# Patient Record
Sex: Female | Born: 1935 | Race: White | Hispanic: No | Smoking: Never smoker
Health system: Southern US, Community
[De-identification: ages and names within clinical notes are randomized; demographics above are authoritative.]

## PROBLEM LIST (undated history)

## (undated) DIAGNOSIS — H269 Unspecified cataract: Secondary | ICD-10-CM

## (undated) DIAGNOSIS — M1990 Unspecified osteoarthritis, unspecified site: Secondary | ICD-10-CM

## (undated) DIAGNOSIS — N189 Chronic kidney disease, unspecified: Secondary | ICD-10-CM

## (undated) DIAGNOSIS — H9190 Unspecified hearing loss, unspecified ear: Secondary | ICD-10-CM

## (undated) DIAGNOSIS — F0390 Unspecified dementia without behavioral disturbance: Secondary | ICD-10-CM

## (undated) DIAGNOSIS — R011 Cardiac murmur, unspecified: Secondary | ICD-10-CM

## (undated) HISTORY — DX: Cardiac murmur, unspecified: R01.1

## (undated) HISTORY — DX: Unspecified osteoarthritis, unspecified site: M19.90

## (undated) HISTORY — DX: Unspecified cataract: H26.9

## (undated) HISTORY — DX: Chronic kidney disease, unspecified: N18.9

## (undated) HISTORY — PX: JOINT REPLACEMENT: SHX530

## (undated) HISTORY — DX: Unspecified hearing loss, unspecified ear: H91.90

## (undated) HISTORY — PX: EYE SURGERY: SHX253

## (undated) HISTORY — PX: FRACTURE SURGERY: SHX138

## (undated) HISTORY — PX: APPENDECTOMY: SHX54

## (undated) HISTORY — DX: Unspecified dementia, unspecified severity, without behavioral disturbance, psychotic disturbance, mood disturbance, and anxiety: F03.90

---

## 2001-02-25 ENCOUNTER — Other Ambulatory Visit: Admission: RE | Admit: 2001-02-25 | Discharge: 2001-02-25 | Payer: Self-pay | Admitting: Gynecology

## 2002-05-29 ENCOUNTER — Encounter: Admission: RE | Admit: 2002-05-29 | Discharge: 2002-05-29 | Payer: Self-pay | Admitting: Geriatric Medicine

## 2002-05-29 ENCOUNTER — Encounter: Payer: Self-pay | Admitting: Geriatric Medicine

## 2003-05-27 ENCOUNTER — Other Ambulatory Visit: Admission: RE | Admit: 2003-05-27 | Discharge: 2003-05-27 | Payer: Self-pay | Admitting: Internal Medicine

## 2003-06-01 ENCOUNTER — Encounter: Admission: RE | Admit: 2003-06-01 | Discharge: 2003-06-01 | Payer: Self-pay | Admitting: Geriatric Medicine

## 2004-06-07 ENCOUNTER — Encounter: Admission: RE | Admit: 2004-06-07 | Discharge: 2004-06-07 | Payer: Self-pay | Admitting: Geriatric Medicine

## 2004-08-03 ENCOUNTER — Other Ambulatory Visit: Admission: RE | Admit: 2004-08-03 | Discharge: 2004-08-03 | Payer: Self-pay | Admitting: Gynecology

## 2005-03-19 ENCOUNTER — Encounter: Admission: RE | Admit: 2005-03-19 | Discharge: 2005-03-19 | Payer: Self-pay | Admitting: Geriatric Medicine

## 2005-05-11 ENCOUNTER — Encounter: Admission: RE | Admit: 2005-05-11 | Discharge: 2005-05-11 | Payer: Self-pay | Admitting: Geriatric Medicine

## 2005-05-16 ENCOUNTER — Encounter: Admission: RE | Admit: 2005-05-16 | Discharge: 2005-05-16 | Payer: Self-pay | Admitting: Geriatric Medicine

## 2005-06-11 ENCOUNTER — Encounter: Admission: RE | Admit: 2005-06-11 | Discharge: 2005-06-11 | Payer: Self-pay | Admitting: Geriatric Medicine

## 2005-09-25 ENCOUNTER — Other Ambulatory Visit: Admission: RE | Admit: 2005-09-25 | Discharge: 2005-09-25 | Payer: Self-pay | Admitting: Gynecology

## 2006-02-04 ENCOUNTER — Encounter: Admission: RE | Admit: 2006-02-04 | Discharge: 2006-02-04 | Payer: Self-pay | Admitting: Geriatric Medicine

## 2006-07-02 ENCOUNTER — Encounter: Admission: RE | Admit: 2006-07-02 | Discharge: 2006-07-02 | Payer: Self-pay | Admitting: Geriatric Medicine

## 2006-10-01 ENCOUNTER — Other Ambulatory Visit: Admission: RE | Admit: 2006-10-01 | Discharge: 2006-10-01 | Payer: Self-pay | Admitting: Gynecology

## 2007-01-14 ENCOUNTER — Observation Stay (HOSPITAL_COMMUNITY): Admission: EM | Admit: 2007-01-14 | Discharge: 2007-01-17 | Payer: Self-pay | Admitting: Emergency Medicine

## 2007-01-27 ENCOUNTER — Inpatient Hospital Stay (HOSPITAL_COMMUNITY): Admission: RE | Admit: 2007-01-27 | Discharge: 2007-01-31 | Payer: Self-pay | Admitting: Orthopedic Surgery

## 2007-07-11 ENCOUNTER — Encounter: Admission: RE | Admit: 2007-07-11 | Discharge: 2007-07-11 | Payer: Self-pay | Admitting: Geriatric Medicine

## 2008-02-04 ENCOUNTER — Encounter: Admission: RE | Admit: 2008-02-04 | Discharge: 2008-02-04 | Payer: Self-pay | Admitting: Geriatric Medicine

## 2008-05-19 IMAGING — CR DG SHOULDER 2+V PORT*R*
2 series · 2 of 2 positions shown · non-contrast
Comparison: none

CLINICAL DATA: Right shoulder fracture.   
PORTABLE RIGHT SHOULDER ? 01/27/07 AT 8125 HOURS:

[view not recorded (1 of 2)]
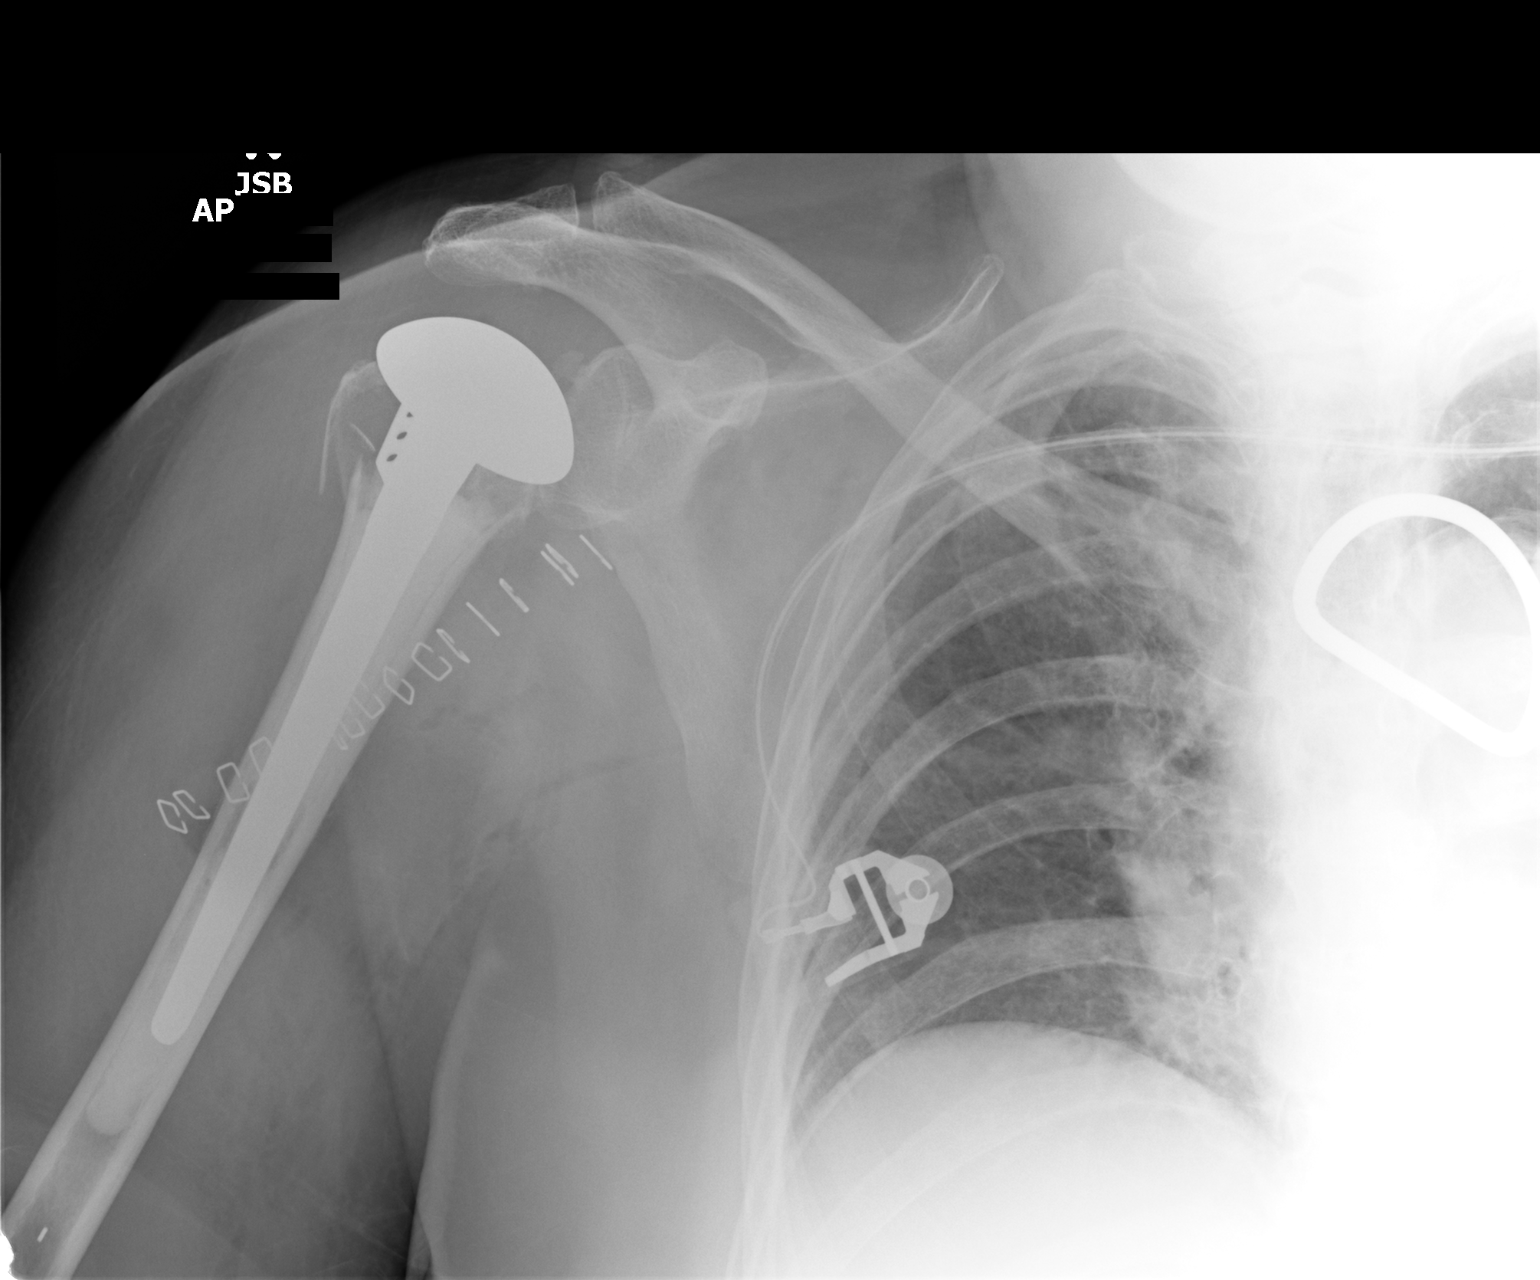

[view not recorded (2 of 2)]
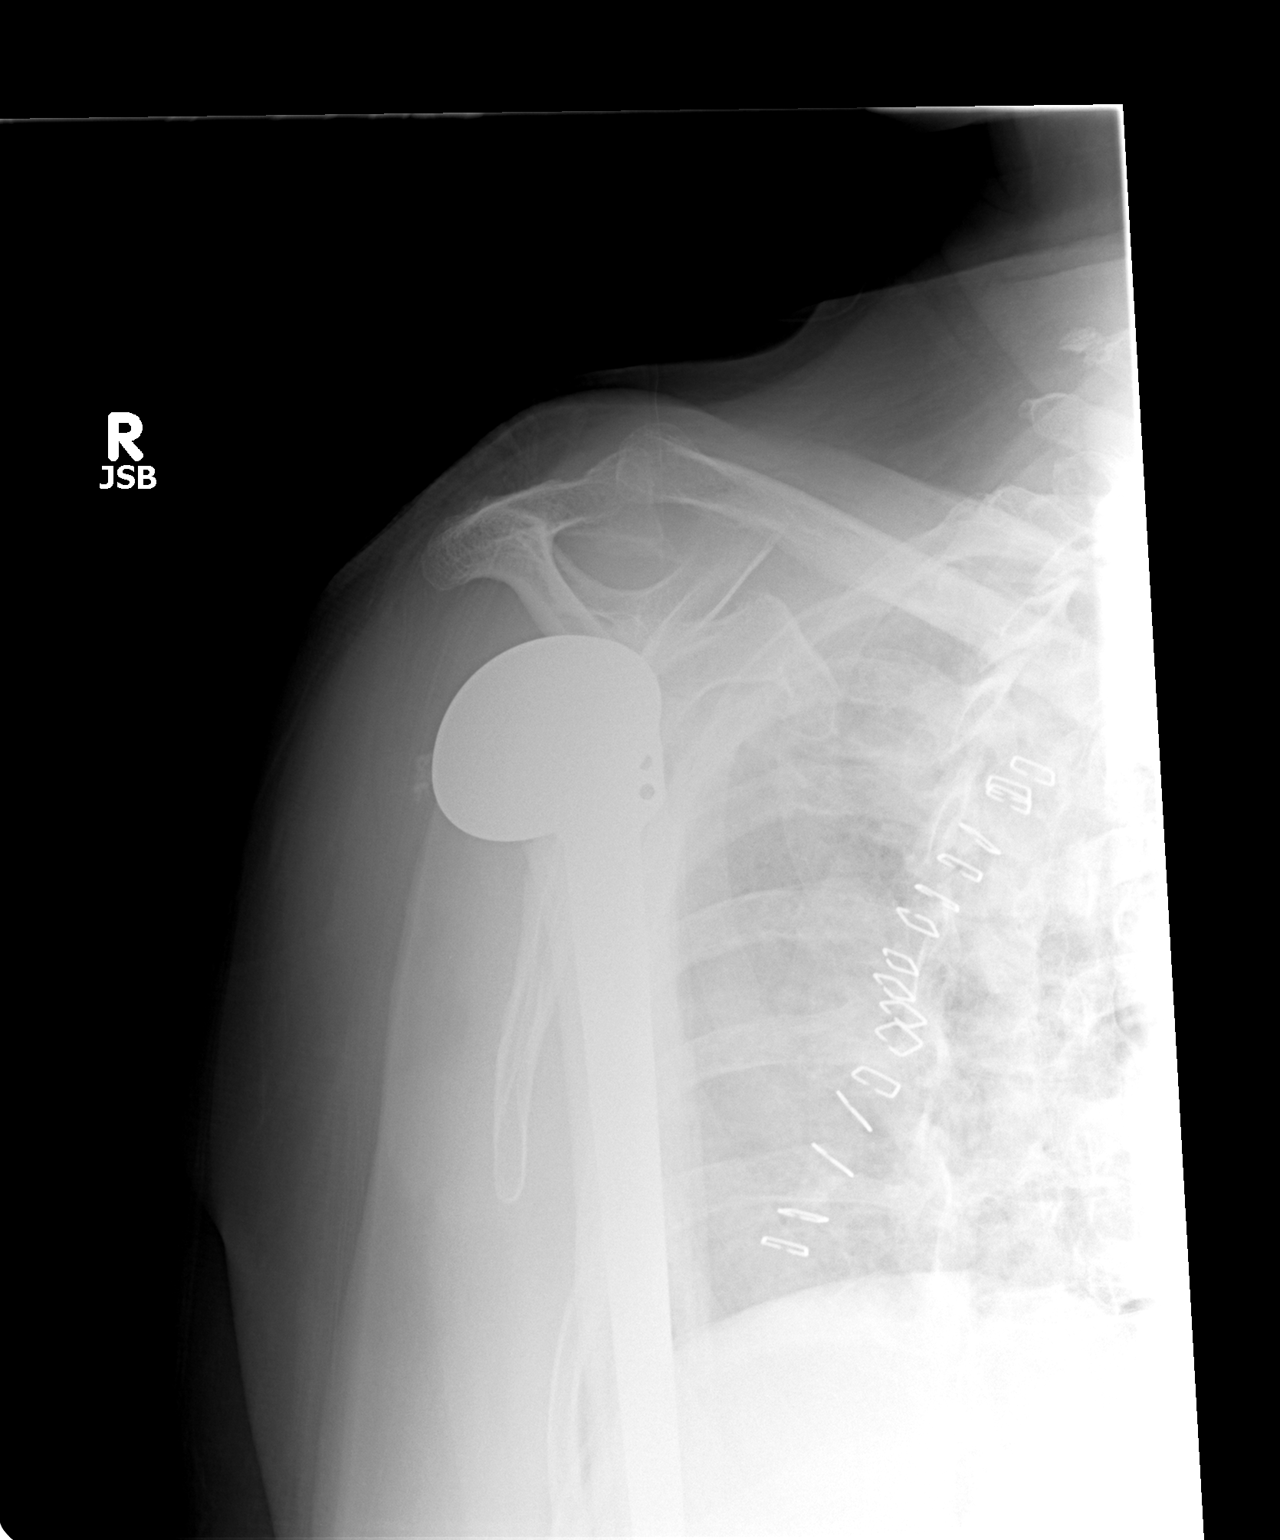

[2 of 2 positions shown; findings below may reference images not displayed]

FINDINGS: There has been a right shoulder hemiarthroplasty for  a fracture of the humeral neck. The humeral head has been resected.  There remains some fracture fragments around the prosthesis.  There is some cement within the humeral shaft.   There is normal alignment.
IMPRESSION: Satisfactory right shoulder hemiarthroplasty.

## 2008-07-13 ENCOUNTER — Encounter: Admission: RE | Admit: 2008-07-13 | Discharge: 2008-07-13 | Payer: Self-pay | Admitting: Geriatric Medicine

## 2009-01-31 ENCOUNTER — Encounter: Payer: Self-pay | Admitting: Gynecology

## 2009-01-31 ENCOUNTER — Ambulatory Visit: Payer: Self-pay | Admitting: Gynecology

## 2009-01-31 ENCOUNTER — Other Ambulatory Visit: Admission: RE | Admit: 2009-01-31 | Discharge: 2009-01-31 | Payer: Self-pay | Admitting: Gynecology

## 2009-02-17 ENCOUNTER — Encounter: Admission: RE | Admit: 2009-02-17 | Discharge: 2009-02-17 | Payer: Self-pay | Admitting: Geriatric Medicine

## 2009-07-15 ENCOUNTER — Encounter: Admission: RE | Admit: 2009-07-15 | Discharge: 2009-07-15 | Payer: Self-pay | Admitting: Geriatric Medicine

## 2009-09-13 ENCOUNTER — Encounter: Admission: RE | Admit: 2009-09-13 | Discharge: 2009-09-13 | Payer: Self-pay | Admitting: Internal Medicine

## 2009-09-22 ENCOUNTER — Encounter: Admission: RE | Admit: 2009-09-22 | Discharge: 2009-09-22 | Payer: Self-pay | Admitting: Geriatric Medicine

## 2010-01-01 ENCOUNTER — Inpatient Hospital Stay (HOSPITAL_COMMUNITY): Admission: EM | Admit: 2010-01-01 | Discharge: 2010-01-04 | Payer: Self-pay | Admitting: Neurological Surgery

## 2010-01-01 ENCOUNTER — Encounter: Payer: Self-pay | Admitting: Emergency Medicine

## 2010-02-03 ENCOUNTER — Encounter: Admission: RE | Admit: 2010-02-03 | Discharge: 2010-02-03 | Payer: Self-pay | Admitting: Neurological Surgery

## 2010-05-07 ENCOUNTER — Encounter: Payer: Self-pay | Admitting: Geriatric Medicine

## 2010-06-29 LAB — DIFFERENTIAL
Basophils Absolute: 0 10*3/uL (ref 0.0–0.1)
Eosinophils Absolute: 0.1 10*3/uL (ref 0.0–0.7)
Lymphocytes Relative: 20 % (ref 12–46)
Lymphs Abs: 1.4 10*3/uL (ref 0.7–4.0)
Neutrophils Relative %: 71 % (ref 43–77)

## 2010-06-29 LAB — COMPREHENSIVE METABOLIC PANEL
CO2: 27 mEq/L (ref 19–32)
Calcium: 9.6 mg/dL (ref 8.4–10.5)
Creatinine, Ser: 0.9 mg/dL (ref 0.4–1.2)
GFR calc Af Amer: >60 mL/min (ref >60)
GFR calc non Af Amer: 59 mL/min — ABNORMAL LOW (ref >60)
Glucose, Bld: 123 mg/dL — ABNORMAL HIGH (ref 70–99)

## 2010-06-29 LAB — PROTIME-INR
INR: 0.94 (ref 0.00–1.49)
Prothrombin Time: 12.8 seconds (ref 11.6–15.2)

## 2010-06-29 LAB — URINALYSIS, ROUTINE W REFLEX MICROSCOPIC
Hgb urine dipstick: NEGATIVE
Nitrite: NEGATIVE
Protein, ur: NEGATIVE mg/dL
Urobilinogen, UA: 0.2 mg/dL (ref 0.0–1.0)

## 2010-06-29 LAB — CBC
Hemoglobin: 13.8 g/dL (ref 12.0–15.0)
MCH: 32 pg (ref 26.0–34.0)
MCHC: 34.5 g/dL (ref 30.0–36.0)
Platelets: 208 10*3/uL (ref 150–400)
RBC: 4.31 MIL/uL (ref 3.87–5.11)
WBC: 6.9 10*3/uL (ref 4.0–10.5)

## 2010-06-29 LAB — URINE MICROSCOPIC-ADD ON

## 2010-08-29 NOTE — Op Note (Signed)
NAMEBYRD, TERRERO                 ACCOUNT NO.:  [id number]   MEDICAL RECORD NO.:  [id number]          PATIENT TYPE:  INP   LOCATION:  5036                         FACILITY:  [facility]   PHYSICIAN:  Doralee Albino. Carola Frost, M.D. DATE OF BIRTH:  [birth year]   DATE OF PROCEDURE:  01/27/2007  DATE OF DISCHARGE:                               OPERATIVE REPORT   PREOPERATIVE DIAGNOSIS:  Right comminuted proximal humerus fracture-  dislocation.   POSTOPERATIVE DIAGNOSIS:  Right comminuted proximal humerus fracture-  dislocation.   PROCEDURE:  Right shoulder hemiarthroplasty using a Biomet cemented 8-mm  stem, 45-mm head, and 17-mm neck.   SURGEON:  Doralee Albino. Carola Frost, M.D.   ASSISTANT:  None.   ANESTHESIA:  General.   COMPLICATIONS:  None.   ESTIMATED BLOOD LOSS:  120 mL.   DRAINS:  None.   DISPOSITION:  PACU.   CONDITION:  Stable.   INDICATIONS FOR PROCEDURE:  Tammy Mitchell is an 77 year old female who  sustained a right proximal humerus fracture-dislocation which was  treated with closed reduction and she had subsequent dislocation.  We  discussed preoperatively the risks and benefits of hemiarthroplasty  versus ORIF given the pattern.  We felt that she would most likely be  treated with partial replacement.  She understood clearly as did her  family the risks and benefits of the procedure, including failure to  obtain union, loss of reduction, pain, instability, loosening, decreased  motion, neurovascular injury, infection, and others.  After full  discussion, they wished to proceed.   DESCRIPTION OF PROCEDURE:  Ms. Shrestha was administered vancomycin  preoperatively and taken to the operating room where general anesthesia  was induced.  She did receive a preoperative interscalene block.  The  right upper extremity was prepped and draped in the usual sterile  fashion after placement of a Foley.   We made a standard deltopectoral approach through a 9-cm incision.  We  developed the  deltopectoral interval and protected the cephalic vein  which was retracted laterally with the deltoid.  We then developed the  clavipectoral fascia and were able to identify the biceps tendon and  biceps groove and used that as a landmark to approach the shoulder.  The  greater and lesser tuberosity segments were developed from the adherent  soft tissues and #2 FiberWire used to capture both the cuff and the  bone.  Bone was of surprisingly good quality.  The humerus did have a  fracture directly through the anatomic neck and was not reconstructable.  This fragment was inferior to the glenoid and was removed with the  Kocher.  It measured most appropriately to 44 x 17-mm size.  The glenoid  had well-preserved cartilage.  There was a more posterior inferior  segment off the head which was also removed.  The greater tuberosity  again was captured with 2-0 FiberWire.  We then sequentially prepared  the humeral shaft going up to a 10 mm.  The 10-mm threaded cuff was  selected and inserted, and then we seated the prosthesis to the  appropriate depth and then followed this with a  trial head, finding that  to be the best fit and confirming the appropriate height.  We then  removed this.  I prepared the canal and placed the cement restricter  distally using a #2 and then used pressurization technique after  inserting the threaded sleeve.  We then used a 8-mm prosthesis so as to  have the appropriate size cement mantle.  We were careful to check our  version, placing her in approximately 25-30 degrees of retroversion.  We  had also made drill holes along the attachment site of the lesser  tuberosity and the greater tuberosity on the anterior flange and lateral  flange of the humeral shaft.  We used imbrication sutures through these  holes to capture the capsule and bone and then also took a separate #2  to pass around the tuberosities themselves, so at the conclusion of the  case the tuberosities  were reapproximated.  The lesser tuberosity was  anterior to the lateral flange with suture both to the shaft and through  the prosthesis as well as an imbrication through the anterior capsule  and into the shaft, and then on the lateral side, the lesser tuberosity  attached with a suture through the cuff bone and prosthesis and then  through the cuff and bone imbrication back to the shaft, and then again  the common suture attaching the tuberosities.  We also repaired the  rotator cuff which was completely torn through the interval.  This was  repaired with 2 number a #2 FiberWire figure-of-eight sutures.  We then  irrigated thoroughly and closed in standard layered fashion with 0  Vicryl, 2-0 Vicryl, and staples for the skin.  Sterile gently  compressive dressing was applied and then a sling.   The patient was awakened from anesthesia and transported to the PACU in  stable condition.   PROGNOSIS:  Ms. Wanek sustained a right shoulder fracture-dislocation  with disruption of her rotator cuff.  We were able to obtain a nice bony  reconstruction with excellent range of motion following closure as well  as good stability and repair of the rotator cuff as well.  She will be  able to begin pendulum motion with passive gentle motion by therapy.  We  anticipate a 2-day stay in the hospital simply given her medical  situation and age, with plans to discharge back to the skilled nursing  facility where she will have daily physical and occupational therapy.  She will not require any formal DVT prophylaxis.  We anticipate progress  in her active assisted range of motion somewhere around 6-8 weeks.      Doralee Albino. Carola Frost, M.D.  Electronically Signed     MHH/MEDQ  D:  01/27/2007  T:  01/28/2007  Job:  [id number]

## 2010-08-29 NOTE — Discharge Summary (Signed)
[name], Mitchell                 ACCOUNT NO.:  [id number]   MEDICAL RECORD NO.:  [id number]          PATIENT TYPE:  INP   LOCATION:  5038                         FACILITY:  [facility]   PHYSICIAN:  Mila Homer. Sherlean Foot, M.D. DATE OF BIRTH:  Nov 16, 1921   DATE OF ADMISSION:  01/14/2007  DATE OF DISCHARGE:  01/15/2007                               DISCHARGE SUMMARY   ADMISSION DIAGNOSIS:  Fracture-dislocation of the right shoulder.   DISCHARGE DIAGNOSES:  1. Fracture-dislocation of the right shoulder.  2. History of hypertension.  3. Solitary kidney.  4. Hypothyroidism.  5. Increased cholesterol.   PROCEDURE:  Closed reduction of right shoulder in the emergency room.   HISTORY:  Ms. Tammy Mitchell is a very pleasant 77 year old white female who was  walking in the park on the day of her admission.  She went to sit down  on the bench, lost of footing and had fallen onto her right shoulder.  She was brought to [facility] Emergency Room, where she was noted to  have a fracture-dislocation of the right shoulder.  At that time,  consultation was made and she was seen in the emergency room and  admitted.   HOSPITAL COURSE:  An 77 year old white female admitted January 14, 2007.  After appropriately evaluated, it was felt that she needed to  have a closed reduction of her right shoulder fracture-dislocation.  At  that time, consent was made and the shoulder was injected with 1%  Xylocaine and 2 mg of morphine were given IV.  A closed reduction was  performed, putting the head approximately 75% reduce into the glenoid.  She remained neurologically intact and vascularly intact before and  after the procedure.  She was then placed into a shoulder immobilizer  and admitted for pain control.  She did well over the her hospital  course.  Initially, a PCA pump was ordered with morphine in a reduced  dosed manner.  Norco 5/325 one q.4 h. p.r.n. pain and 2 p.o. q.4 h.  p.r.n. severe pain was ordered.   Appropriate laboratory studies were  obtained.  She had improvement in her pain, but still was symptomatic.  It was felt that discharge planning was appropriate for followup in the  office for reevaluation.  She was placed in a sugar-tong splint to the  forearm by [employer] to try to create a hanging cast-type position.  She  is at this time going to be discharged to the [facility] and will  probably need to have more of a skilled position than her independent  living.  She was discharged in improved condition.   LABORATORY DATA:  She was admitted with a hemoglobin of 11.7, hematocrit  of 34.2%, white count 9600, platelets 238,000.  Chemistries revealed a  sodium of 138, potassium 4.3, chloride 103, CO2 27, glucose 107, BUN 22,  creatinine 0.9.  Pro time 13.2, INR 1, PTT 29.  AST 34, ALT 22, alkaline  phosphatase 56, bilirubin 1, albumin 3.5.  GFR was 60.  Calcium 9.2.   Chest x-ray revealed no acute chest findings.   EKG  revealed sinus bradycardia with marked sinus arrhythmia with first-  degree block, cannot rule out anterior infarct, age indeterminate; this  was not confirmed.   DISCHARGE INSTRUCTIONS:  She is able to have a regular diet.  No lifting  or driving for 6 weeks.  Ice to the right shoulder, on 20 minutes, off  30 minutes, placing a towel between the shoulder and the ice so as not  to endanger the skin.  Keep in her sling.  She may use her right wrist  and elbow.  Hand function is allowable.   DISCHARGE MEDICATIONS:  Prescription for Norco 5/325 one tablet every 3-  4 hours as needed for pain, two tabs every 4-6 hours as needed for  severe pain.   She will continue with her medications of:  1. Lisinopril 2.5 mg daily.  2. Hydrochlorothiazide 12.5 mg daily.  3. Synthroid 0.88 mg daily.  4. Simvastatin 20 mg daily.  5. Actonel 35 mg weekly.  6. Aspirin 81 mg daily.   FOLLOWUP:  She will need to follow up with Dr. Carola Frost on Monday, October  6, at 9:45  a.m.      Oris Drone Petrarca, P.A.-C.    ______________________________  Mila Homer. Sherlean Foot, M.D.    BDP/MEDQ  D:  01/15/2007  T:  01/16/2007  Job:  [id number]   cc:   Hal T. Stoneking, M.D.

## 2010-08-29 NOTE — Discharge Summary (Signed)
Tammy Mitchell, Tammy Mitchell                 ACCOUNT NO.:  [id number]   MEDICAL RECORD NO.:  [id number]          PATIENT TYPE:  INP   LOCATION:  5036                         FACILITY:  [facility]   PHYSICIAN:  Doralee Albino. Carola Frost, M.D. DATE OF BIRTH:  July 24, 1921   DATE OF ADMISSION:  01/27/2007  DATE OF DISCHARGE:                               DISCHARGE SUMMARY   DISCHARGE DIAGNOSIS:  Right comminuted proximal humerus fracture-  dislocation.   ADDITIONAL DISCHARGE DIAGNOSES:  1. History of hypertension.  2. Solitary kidney.  3. Hypothyroidism.  4. Hypercholesterolemia.   PROCEDURES PERFORMED:  Right shoulder hemiarthroplasty on January 27, 2007.   BRIEF SUMMARY OF HOSPITAL COURSE:  Tammy Mitchell underwent the procedure  described above without complications.  Postoperatively she did well  with pendulum range of motion of the shoulder in addition to gentle  passive range of motion of the shoulder and active range of motion of  the elbow, wrist and hand with assistance of occupational therapy.  She  continued to mobilize with physical therapy just as assistance for  ambulation.  During her hospital stay it was required that she receive 2  units of packed red blood cells for acute blood loss anemia.  She  responded very well to this and was able to resume a regular diet and  bowel function.  Her wound was noted to be clean, dry, and intact on  postop day #2 and her bandage was changed at that time.  On postop day  #4 she complained of slight dysuria, for which a UA and urine culture  were obtained.  At this time those lab tests remain pending.  Should she  require antibiotics, a prescription will be given to the nursing  facility.   DISCHARGE INSTRUCTIONS:  Tammy Mitchell is to continue to mobilize with  physical therapy should she need assistance with ambulation.  Tammy Mitchell  will continue to work with occupational therapy or physical therapy  should occupational therapy not be available for gentle  passive range of  motion of the right shoulder in addition to pendulum exercises of the  right shoulder.  She will also work with therapy for active range of  motion of the elbow, wrist and hand.  She will remain in a sling as  needed for comfort.  Dressing should be changed daily until there is no  drainage.  After there has been no drainage for 48 hours, she may  shower.   I will plan to follow up with her in 2 weeks, at which time staples will  be removed and new radiographs will be obtained.  The patient will  continue to be limited to gentle passive range of motion and pendulum  exercises.   DISCHARGE MEDICATIONS:  Percocet 5/325 mg, the patient will take one  tablet q.4h. as needed for pain control.   She may resume the medications she was on prior to admission, which  include:  1. Lisinopril 2.5 mg daily.  2. Hydrochlorothiazide 12.5 mg daily.  3. Synthroid 0.88 mg daily.  4. Simvastatin 20 mg daily.  5. Actonel 35  mg weekly.  6. Aspirin 81 mg daily.  7. She may also continue to take her multivitamin, fish oil and      calcium with vitamin D as she has previously been taking.   The patient was on Norco 5/325 mg prior to admission.  At this time she  will discontinue that and use the Percocet in its place.     ______________________________  Mearl Latin, PA      Doralee Albino. Carola Frost, M.D.  Electronically Signed    KWP/MEDQ  D:  01/31/2007  T:  01/31/2007  Job:  [id number]

## 2011-01-24 LAB — DIFFERENTIAL
Basophils Absolute: 0
Basophils Relative: 0
Eosinophils Absolute: 0.3
Monocytes Relative: 9
Neutrophils Relative %: 65

## 2011-01-24 LAB — BASIC METABOLIC PANEL
Calcium: 8.1 — ABNORMAL LOW
Creatinine, Ser: 0.76
GFR calc Af Amer: >60
GFR calc non Af Amer: >60
Glucose, Bld: 127 — ABNORMAL HIGH
Sodium: 130 — ABNORMAL LOW

## 2011-01-24 LAB — PREPARE RBC (CROSSMATCH)

## 2011-01-24 LAB — CBC
Hemoglobin: 7.7 — CL
MCHC: 33.9
MCHC: 34
MCV: 91.2
Platelets: 205
RBC: 3.14 — ABNORMAL LOW
RBC: 3.24 — ABNORMAL LOW
RDW: 13.4
RDW: 14.5 — ABNORMAL HIGH
RDW: 14.9 — ABNORMAL HIGH

## 2011-01-24 LAB — URINE CULTURE

## 2011-01-24 LAB — URINALYSIS, ROUTINE W REFLEX MICROSCOPIC
Glucose, UA: NEGATIVE
Protein, ur: NEGATIVE
Urobilinogen, UA: 0.2

## 2011-01-25 LAB — BASIC METABOLIC PANEL
BUN: 22
CO2: 27
Calcium: 8.9
Chloride: 100
Chloride: 102
Creatinine, Ser: 0.85
GFR calc Af Amer: >60
GFR calc non Af Amer: >60
Glucose, Bld: 102 — ABNORMAL HIGH
Potassium: 4.3

## 2011-01-25 LAB — DIFFERENTIAL
Basophils Relative: 0
Lymphs Abs: 1.1
Monocytes Absolute: 0.4
Monocytes Relative: 4
Neutro Abs: 8.1 — ABNORMAL HIGH

## 2011-01-25 LAB — I-STAT 8, (EC8 V) (CONVERTED LAB)
BUN: 26 — ABNORMAL HIGH
Chloride: 103
HCT: 37
Hemoglobin: 12.6
Operator id: 234501
Potassium: 5.1
Sodium: 135

## 2011-01-25 LAB — TYPE AND SCREEN

## 2011-01-25 LAB — CBC
HCT: 32.1 — ABNORMAL LOW
MCHC: 34
MCV: 93.9
MCV: 94.2
Platelets: 348
Platelets: 238
RBC: 3.04 — ABNORMAL LOW
RDW: 13.3
RDW: 13
WBC: 14 — ABNORMAL HIGH

## 2011-01-25 LAB — COMPREHENSIVE METABOLIC PANEL
ALT: 22
Albumin: 3.5
Alkaline Phosphatase: 56
BUN: 22
Calcium: 9.2
Potassium: 4.3
Sodium: 138
Total Protein: 6.9

## 2011-01-25 LAB — POCT I-STAT CREATININE
Creatinine, Ser: 1
Operator id: 234501

## 2011-01-25 LAB — URINALYSIS, ROUTINE W REFLEX MICROSCOPIC
Bilirubin Urine: NEGATIVE
Bilirubin Urine: NEGATIVE
Glucose, UA: NEGATIVE
Ketones, ur: NEGATIVE
Nitrite: NEGATIVE
Specific Gravity, Urine: 1.019
Urobilinogen, UA: 0.2
pH: 6.5
pH: 7

## 2011-01-25 LAB — URINE MICROSCOPIC-ADD ON

## 2011-01-25 LAB — APTT: aPTT: 29

## 2011-01-25 LAB — ABO/RH: ABO/RH(D): O POS

## 2011-01-25 LAB — PROTIME-INR: INR: 1

## 2011-05-27 IMAGING — CT CT HEAD W/O CM
2 series · 16 of 30 positions shown, 18 images · non-contrast
Comparison: 01/02/2010 and earlier.

CLINICAL DATA: 88-year-old female with subdural hematoma,
concussion.

CT HEAD WITHOUT CONTRAST
TECHNIQUE: Contiguous axial images were obtained from the base of
the skull through the vertex without contrast.

[Series 3: head bone · axial · 0.49mm/px · z∈[+18,+138]mm · 8 of 56 slices shown]
[im 6/56  bone]
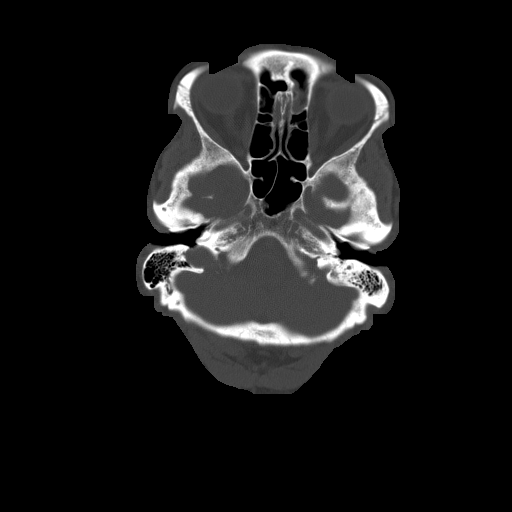
[im 12/56  bone]
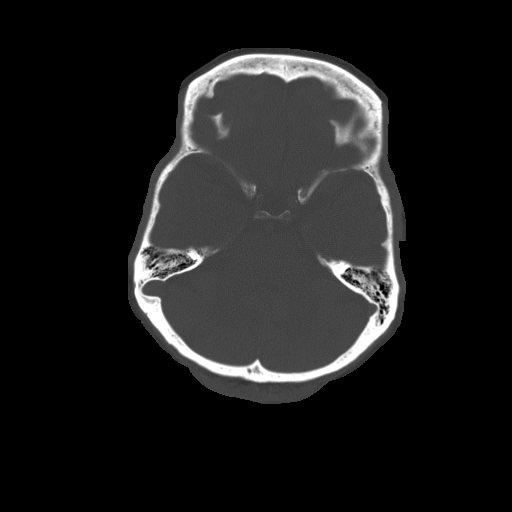
[im 18/56  bone]
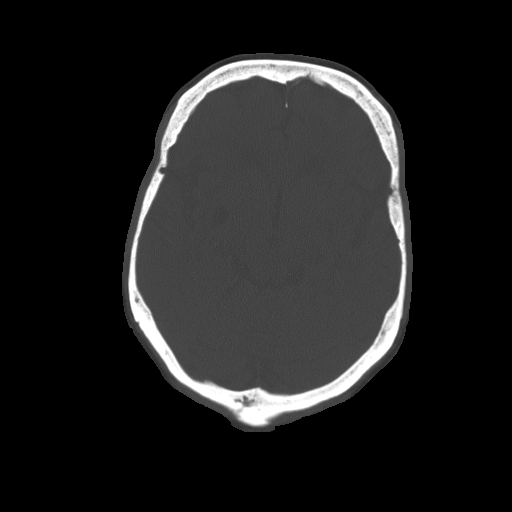
[im 24/56  bone]
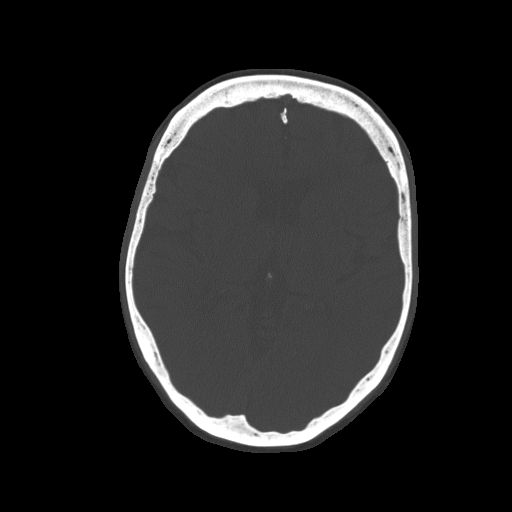
[im 32/56  bone]
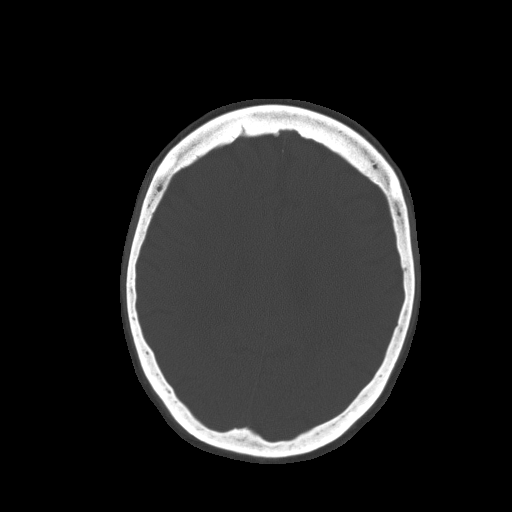
[im 38/56  bone]
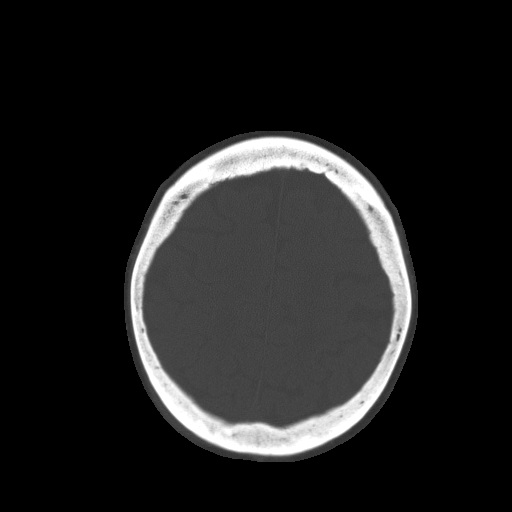
[im 44/56  bone]
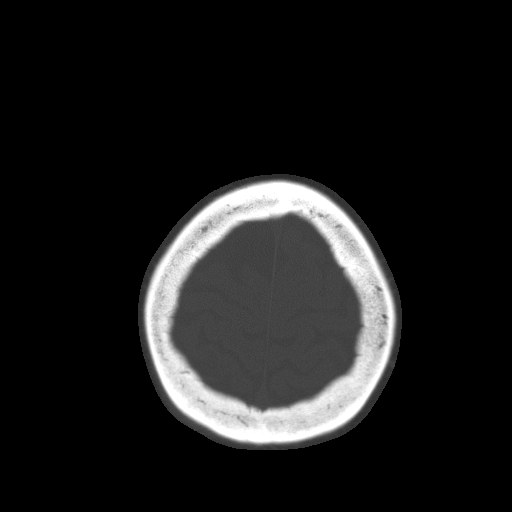
[im 50/56  bone]
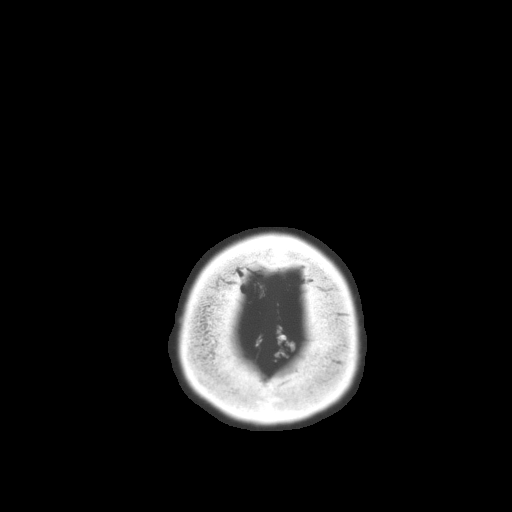

[Series 32: 3d filtered head w/o · axial · non-contrast · 0.49mm/px · z∈[+23,+137]mm · 8 of 28 slices shown, 10 images]
[im 4/28  brain]
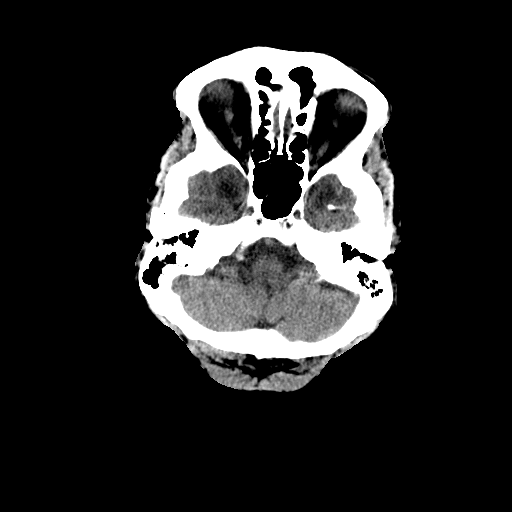
[im 4/28  bone]
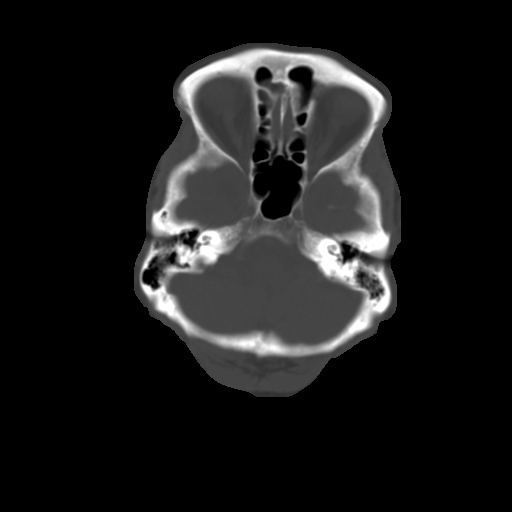
[im 7/28  brain]
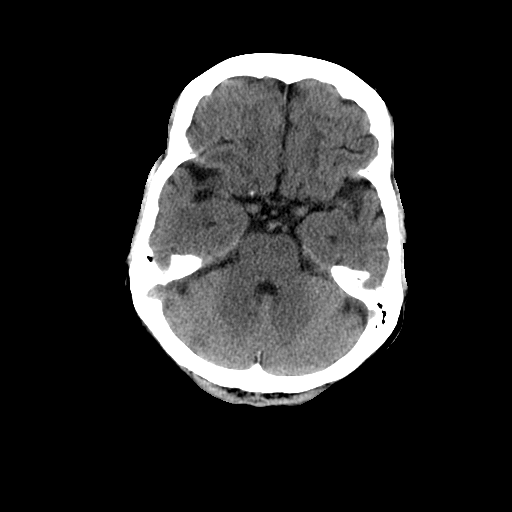
[im 10/28  brain]
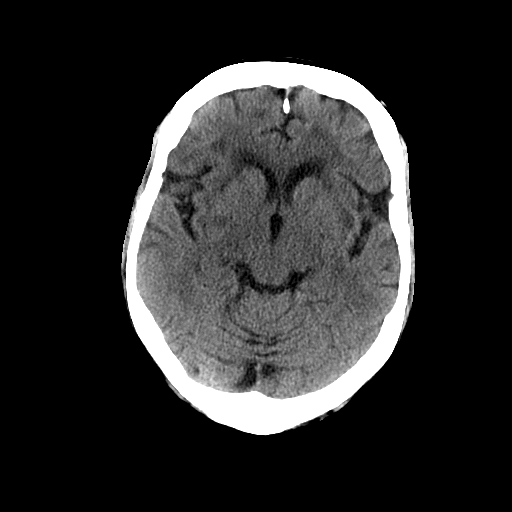
[im 13/28  brain]
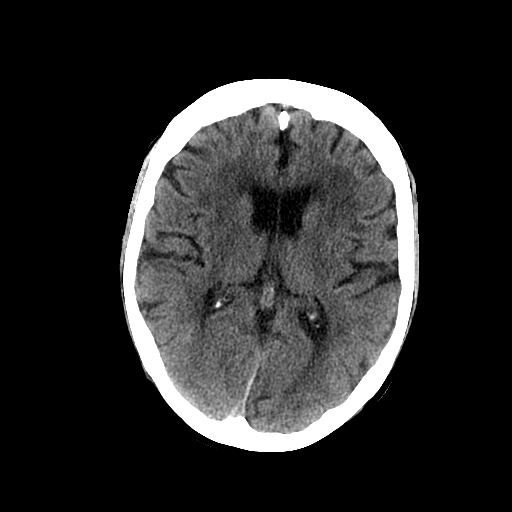
[im 16/28  brain]
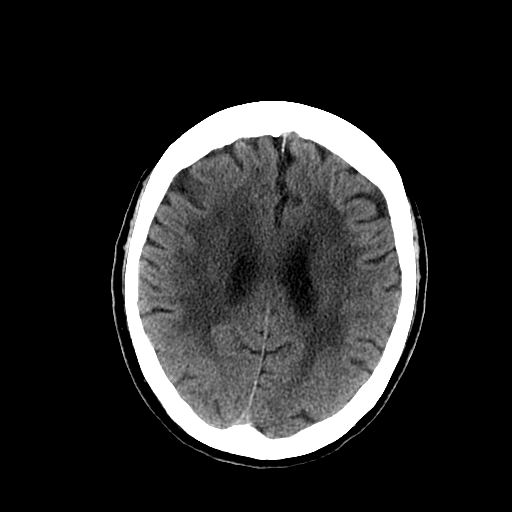
[im 16/28  bone]
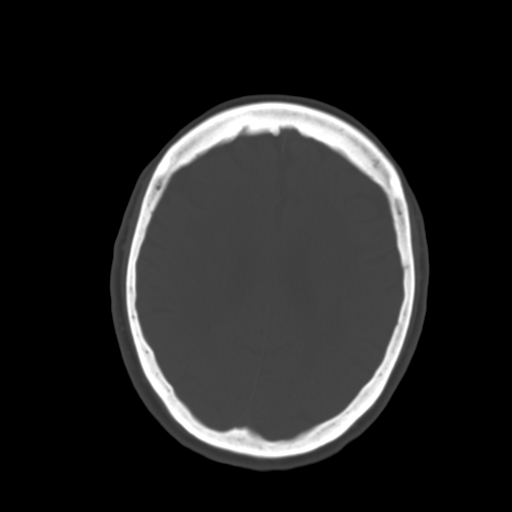
[im 19/28  brain]
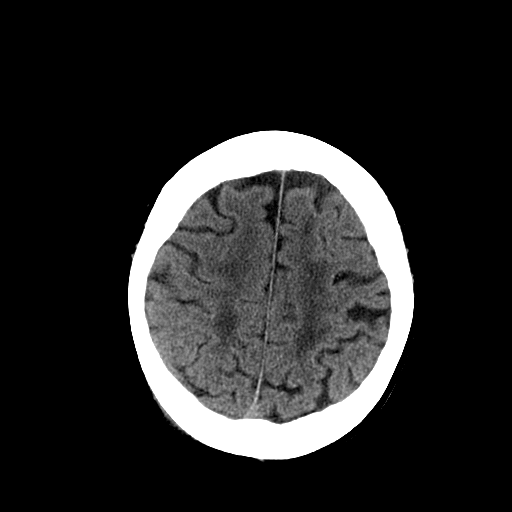
[im 22/28  brain]
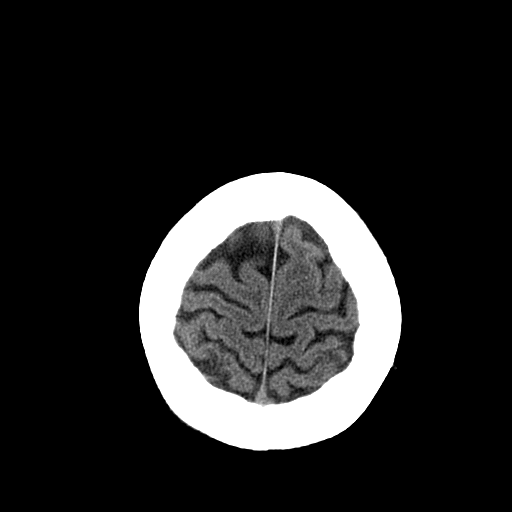
[im 25/28  brain]
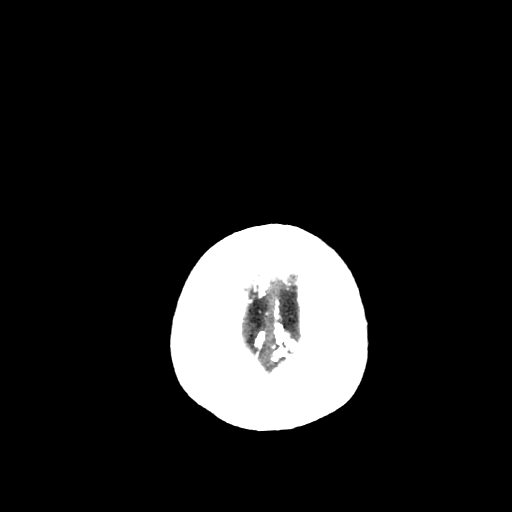

[16 of 30 positions shown; findings below may reference images not displayed]

FINDINGS: Visualized orbits and scalp soft tissues are within
normal limits.  Stable paranasal sinuses. No acute osseous
abnormality identified.  Calcified atherosclerosis at the skull
base.

Small right side posterior convexity subdural hematoma has
resolved.  Subtle temporal and superior frontal gyrus parenchymal
contusions have resolved.  Severe confluent white matter
hypodensity re-identified.  No ventriculomegaly. No midline shift,
mass effect, or evidence of mass lesion.  No acute intracranial
hemorrhage identified.  No evidence of cortically based acute
infarction identified.    Chronic lacunar infarcts in the
cerebellum and deep gray matter nuclei.
IMPRESSION: 1.  Interval resolution of small right subdural hematoma and
parenchymal contusions.
2.  No new intracranial abnormality.
3.  Advanced small vessel ischemic disease.

## 2012-07-14 ENCOUNTER — Ambulatory Visit
Admission: RE | Admit: 2012-07-14 | Discharge: 2012-07-14 | Disposition: A | Discharge status: Home / Self Care | Payer: Medicare Other | Source: Ambulatory Visit | Attending: Geriatric Medicine | Admitting: Geriatric Medicine

## 2012-07-14 ENCOUNTER — Other Ambulatory Visit: Payer: Self-pay | Admitting: Geriatric Medicine

## 2012-07-14 DIAGNOSIS — W19XXXA Unspecified fall, initial encounter: Secondary | ICD-10-CM

## 2013-03-05 ENCOUNTER — Other Ambulatory Visit: Payer: Self-pay | Admitting: Internal Medicine

## 2013-03-05 ENCOUNTER — Ambulatory Visit
Admission: RE | Admit: 2013-03-05 | Discharge: 2013-03-05 | Disposition: A | Discharge status: Home / Self Care | Payer: Medicare Other | Source: Ambulatory Visit | Attending: Internal Medicine | Admitting: Internal Medicine

## 2013-03-05 DIAGNOSIS — W19XXXA Unspecified fall, initial encounter: Secondary | ICD-10-CM

## 2013-09-19 ENCOUNTER — Ambulatory Visit (INDEPENDENT_AMBULATORY_CARE_PROVIDER_SITE_OTHER): Payer: MEDICARE | Admitting: Family Medicine

## 2013-09-19 VITALS — BP 112/54 | HR 71 | Temp 98.3°F | Resp 16 | Ht 68.0 in | Wt 122.0 lb

## 2013-09-19 DIAGNOSIS — R21 Rash and other nonspecific skin eruption: Secondary | ICD-10-CM

## 2013-09-19 DIAGNOSIS — L853 Xerosis cutis: Secondary | ICD-10-CM

## 2013-09-19 DIAGNOSIS — L258 Unspecified contact dermatitis due to other agents: Secondary | ICD-10-CM

## 2013-09-19 MED ORDER — TRIAMCINOLONE ACETONIDE 0.5 % EX OINT: 1.0000 "application " | TOPICAL_OINTMENT | Freq: Two times a day (BID) | CUTANEOUS | Status: DC | PRN

## 2013-09-19 MED ORDER — MUPIROCIN 2 % EX OINT
1.0000 | TOPICAL_OINTMENT | Freq: Three times a day (TID) | CUTANEOUS | Status: DC
Start: 2013-09-19 — End: 2013-11-16

## 2013-09-19 NOTE — Patient Instructions (Signed)
Drink plenty of water daily. Consider a humidifier for the room where you sleep. Bathe once daily. Avoid using HOT water, as it dries skin.  Avoid deodorant soaps (Dial is the worst!) and stick with gentle cleansers (I like Cetaphil Liquid Cleanser, and Dove soap).  After bathing, dry off completely, then apply a thick emollient cream (I like Cetaphil Moisturizing Cream, or Eucerin) Apply the cream twice daily, or more!  triamcinolone up to twice per day for itching areas only. Try to avoid scratching affected areas.   If any wet areas, or appears to be infected, apply antibiotic ointment (mupirocin) 3 times per day for 1 week.  However, if increase redness or not improving with that ointment in a few days - return here or primary provider for recheck.

## 2013-09-19 NOTE — Progress Notes (Addendum)
Subjective:    Patient ID: Tammy Mitchell, female    DOB: 03/11/1922, 80 y.o.   MRN: [id number]  Rash   Chief Complaint  Patient presents with  . Rash    hands, facial   This chart was scribed for Meredith Staggers, MD by Andrew Au, ED Scribe. This patient was seen in room 10 and the patient's care was started at 1:39 PM.  HPI Comments: Tammy Mitchell is a 80 y.o. female with h/o dementia who presents to the [facility] complaining of a itchy erythematous rash located on face, right hand, left arm, and right leg. Per daughter pt has seen dermatologist a couple years ago for similar rash and was told rash was caused due to age and skin dryness. She states last time rash flare up pt received an injection of antihistamine. Pt is unable to see dermatologist due to 6 week wait for an appointment. Per daughter rash has had discharge and blistering. Pt states itching during the night. Pt denies being outside and exposure to poison ivy.   Pt live in an assistant living home.  There are no active problems to display for this patient.  Past Medical History  Diagnosis Date  . Cataract   . Arthritis   . Chronic kidney disease   . Heart murmur   . Dementia   . Hearing loss    Past Surgical History  Procedure Laterality Date  . Appendectomy    . Eye surgery    . Fracture surgery    . Joint replacement     Allergies  Allergen Reactions  . Sulfa Antibiotics Rash   Prior to Admission medications   Medication Sig Start Date End Date Taking? Authorizing Provider  Acetaminophen (TYLENOL 8 HOUR PO) Take by mouth.   Yes Historical Provider, MD  Levothyroxine Sodium (SYNTHROID PO) Take 1 tablet by mouth daily.   Yes Historical Provider, MD  Multiple Vitamin (MULTIVITAMIN) tablet Take 1 tablet by mouth daily.   Yes Historical Provider, MD  Multiple Vitamins-Minerals (OCUVITE PO) Take 1 tablet by mouth daily.   Yes Historical Provider, MD   History   Social History  .  Marital Status: Widowed    Spouse Name: N/A    Number of Children: N/A  . Years of Education: N/A   Occupational History  . Not on file.   Social History Main Topics  . Smoking status: Never Smoker   . Smokeless tobacco: Not on file  . Alcohol Use: Not on file  . Drug Use: No  . Sexual Activity: Not on file   Other Topics Concern  . Not on file   Social History Narrative  . No narrative on file   Review of Systems  Skin: Positive for rash.   Objective:   Physical Exam  Nursing note and vitals reviewed. Constitutional: She is oriented to person, place, and time. She appears well-developed and well-nourished. No distress.  HENT:  Head: Normocephalic and atraumatic.  Eyes: Conjunctivae and EOM are normal.  Neck: Normal range of motion.  Cardiovascular: Normal rate.   Pulmonary/Chest: Effort normal.  Musculoskeletal: Normal range of motion.  Neurological: She is alert and oriented to person, place, and time.  Skin: Skin is warm and dry.  Right  lateral calf -large patch of excoriated skin slightly dry. Left arm- few small erythematous patches excoriated with dry skin Right hand- large patch over dorsal of right hand with erythema, scaling, and dry skin along with right  3rd proximal phalanx excoriated. no interdigital lesions Right ear. Thickened excoriated dry skin Left face- small patch of dry skin slightly excoriated  Psychiatric: She has a normal mood and affect. Her behavior is normal.    Assessment & Plan:   1. Dry skin dermatitis   2. Rash and nonspecific skin eruption    Tammy Mitchell is a 80 y.o. female Dry skin dermatitis - Plan: triamcinolone ointment (KENALOG) 0.5 %  Rash and nonspecific skin eruption - Plan: triamcinolone ointment (KENALOG) 0.5 %, mupirocin ointment (BACTROBAN) 2 %  Suspected dry skin dermatitis with excoriation from scratching - hx of same in past. Dry skin instructions below, start TAC BID and Eucerin as needed. Does not appear  infected at this time, but has had this in past - rx for bactroban if needed. rtc precautions. Discussed with family member.  Cautioned about benadryl use d/t anticholinergic effects, and to use lowest effective dose only if absolutely needed for itching. If rash not improving with above - consider dermatology eval.   Meds ordered this encounter  Medications  . Levothyroxine Sodium (SYNTHROID PO)    Sig: Take 1 tablet by mouth daily.  . Multiple Vitamin (MULTIVITAMIN) tablet    Sig: Take 1 tablet by mouth daily.  . Multiple Vitamins-Minerals (OCUVITE PO)    Sig: Take 1 tablet by mouth daily.  . Acetaminophen (TYLENOL 8 HOUR PO)    Sig: Take by mouth.  . triamcinolone ointment (KENALOG) 0.5 %    Sig: Apply 1 application topically 2 (two) times daily as needed.    Dispense:  30 g    Refill:  1  . mupirocin ointment (BACTROBAN) 2 %    Sig: Apply 1 application topically 3 (three) times daily.    Dispense:  22 g    Refill:  0   Patient Instructions  Drink plenty of water daily. Consider a humidifier for the room where you sleep. Bathe once daily. Avoid using HOT water, as it dries skin.  Avoid deodorant soaps (Dial is the worst!) and stick with gentle cleansers (I like Cetaphil Liquid Cleanser, and Dove soap).  After bathing, dry off completely, then apply a thick emollient cream (I like Cetaphil Moisturizing Cream, or Eucerin) Apply the cream twice daily, or more!  triamcinolone up to twice per day for itching areas only. Try to avoid scratching affected areas.   If any wet areas, or appears to be infected, apply antibiotic ointment (mupirocin) 3 times per day for 1 week.  However, if increase redness or not improving with that ointment in a few days - return here or primary provider for recheck.      I personally performed the services described in this documentation, which was scribed in my presence. The recorded information has been reviewed and considered, and addended by me as  needed.

## 2013-11-16 ENCOUNTER — Ambulatory Visit (INDEPENDENT_AMBULATORY_CARE_PROVIDER_SITE_OTHER): Payer: MEDICARE | Admitting: Emergency Medicine

## 2013-11-16 VITALS — BP 136/58 | HR 69 | Temp 97.7°F | Resp 16

## 2013-11-16 DIAGNOSIS — L853 Xerosis cutis: Secondary | ICD-10-CM

## 2013-11-16 DIAGNOSIS — R609 Edema, unspecified: Secondary | ICD-10-CM

## 2013-11-16 DIAGNOSIS — R21 Rash and other nonspecific skin eruption: Secondary | ICD-10-CM

## 2013-11-16 DIAGNOSIS — L258 Unspecified contact dermatitis due to other agents: Secondary | ICD-10-CM

## 2013-11-16 LAB — POCT CBC
GRANULOCYTE PERCENT: 58.8 % (ref 37–80)
HCT, POC: 37.5 % — AB (ref 37.7–47.9)
Hemoglobin: 11.9 g/dL — AB (ref 12.2–16.2)
Lymph, poc: 1.9 (ref 0.6–3.4)
MCH, POC: 30.3 pg (ref 27–31.2)
MCHC: 31.9 g/dL (ref 31.8–35.4)
MCV: 94.9 fL (ref 80–97)
MID (CBC): 0.6 (ref 0–0.9)
MPV: 7.7 fL (ref 0–99.8)
PLATELET COUNT, POC: 191 10*3/uL (ref 142–424)
POC GRANULOCYTE: 3.6 (ref 2–6.9)
POC LYMPH PERCENT: 31.1 %L (ref 10–50)
POC MID %: 10.1 %M (ref 0–12)
RBC: 3.94 M/uL — AB (ref 4.04–5.48)
RDW, POC: 14.8 %
WBC: 6.1 10*3/uL (ref 4.6–10.2)

## 2013-11-16 MED ORDER — FUROSEMIDE 40 MG PO TABS: 40.0000 mg | ORAL_TABLET | Freq: Every day | ORAL | Status: AC

## 2013-11-16 MED ORDER — TRIAMCINOLONE ACETONIDE 0.5 % EX OINT: 1.0000 "application " | TOPICAL_OINTMENT | Freq: Two times a day (BID) | CUTANEOUS | Status: DC | PRN

## 2013-11-16 NOTE — Patient Instructions (Signed)

## 2013-11-16 NOTE — Progress Notes (Signed)
[facility] [address], [place] [id number] [phone]  Date:  11/16/2013   Name:  Tammy Mitchell   DOB:  12/28/1921   MRN:  [id number]  PCP:  No PCP Per Patient    Chief Complaint: Edema   History of Present Illness:  Tammy Mitchell is a 80 y.o. very pleasant female patient who presents with the following:  History of edema and unilateral kidney.  Is on a low dose of lasix (20 M W F).  Now has marked lower extremity edema with excoriated weeping drainage from right leg.  No shortness of breath or wheezing.  No chest pain or tightness.  No improvement with over the counter medications or other home remedies. . No improvement with over the counter medications or other home remedies. Denies other complaint or health concern today.   There are no active problems to display for this patient.   Past Medical History  Diagnosis Date  . Cataract   . Arthritis   . Chronic kidney disease   . Heart murmur   . Dementia   . Hearing loss     Past Surgical History  Procedure Laterality Date  . Appendectomy    . Eye surgery    . Fracture surgery    . Joint replacement      History  Substance Use Topics  . Smoking status: Never Smoker   . Smokeless tobacco: Not on file  . Alcohol Use: No    Family History  Problem Relation Age of Onset  . Stroke Mother   . Heart disease Father   . Heart disease Brother   . Stroke Brother     Allergies  Allergen Reactions  . Sulfa Antibiotics Rash    Medication list has been reviewed and updated.  Current Outpatient Prescriptions on File Prior to Visit  Medication Sig Dispense Refill  . Acetaminophen (TYLENOL 8 HOUR PO) Take by mouth.      . Levothyroxine Sodium (SYNTHROID PO) Take 88 mcg by mouth daily.       . Multiple Vitamin (MULTIVITAMIN) tablet Take 1 tablet by mouth daily.      . Multiple Vitamins-Minerals (OCUVITE PO) Take 1 tablet by mouth daily.       No current facility-administered medications on  file prior to visit.    Review of Systems:  As per HPI, otherwise negative.    Physical Examination: Filed Vitals:   11/16/13 1509  BP: 136/58  Pulse: 69  Temp: 97.7 F (36.5 C)  Resp: 16   There were no vitals filed for this visit. There is no weight on file to calculate BMI. Ideal Body Weight:    GEN: WDWN, NAD, Non-toxic, A & O x 3 HEENT: Atraumatic, Normocephalic. Neck supple. No masses, No LAD. Ears and Nose: No external deformity. CV: RRR, No M/G/R. No JVD. No thrill. No extra heart sounds. PULM: CTA B, no wheezes, crackles, rhonchi. No retractions. No resp. distress. No accessory muscle use. ABD: S, NT, ND, +BS. No rebound. No HSM. EXTR: No c/c/  +4 right lower and +3 left lower leg edema.   NEURO Normal gait.  PSYCH: Normally interactive. Conversant. Not depressed or anxious appearing.  Calm demeanor.  SKIN:  Erythema on arms and marked excoriation right lower leg  Assessment and Plan: Edema Increase lasix Refill triamcinolone  Signed,  Phillips OdorJeffery Anderson, MD

## 2013-11-17 LAB — COMPREHENSIVE METABOLIC PANEL
ALBUMIN: 4.2 g/dL (ref 3.5–5.2)
ALK PHOS: 67 U/L (ref 39–117)
ALT: 18 U/L (ref 0–35)
AST: 30 U/L (ref 0–37)
BUN: 24 mg/dL — ABNORMAL HIGH (ref 6–23)
CALCIUM: 9.6 mg/dL (ref 8.4–10.5)
CHLORIDE: 105 meq/L (ref 96–112)
CO2: 29 mEq/L (ref 19–32)
Creat: 0.93 mg/dL (ref 0.50–1.10)
Glucose, Bld: 89 mg/dL (ref 70–99)
POTASSIUM: 4.4 meq/L (ref 3.5–5.3)
Sodium: 142 mEq/L (ref 135–145)
Total Bilirubin: 0.5 mg/dL (ref 0.2–1.2)
Total Protein: 6.8 g/dL (ref 6.0–8.3)

## 2013-11-23 ENCOUNTER — Ambulatory Visit (HOSPITAL_COMMUNITY): Payer: Medicare Other | Attending: Geriatric Medicine

## 2013-11-23 ENCOUNTER — Other Ambulatory Visit (HOSPITAL_COMMUNITY): Payer: Self-pay | Admitting: Geriatric Medicine

## 2013-11-23 DIAGNOSIS — R609 Edema, unspecified: Secondary | ICD-10-CM

## 2013-11-23 NOTE — Progress Notes (Signed)
2D Echo completed. 11/23/2013 

## 2014-02-09 ENCOUNTER — Ambulatory Visit (INDEPENDENT_AMBULATORY_CARE_PROVIDER_SITE_OTHER): Payer: MEDICARE | Admitting: Family Medicine

## 2014-02-09 VITALS — BP 128/70 | HR 70 | Temp 99.3°F | Resp 18 | Ht 65.0 in | Wt 118.2 lb

## 2014-02-09 DIAGNOSIS — E639 Nutritional deficiency, unspecified: Secondary | ICD-10-CM

## 2014-02-09 DIAGNOSIS — L853 Xerosis cutis: Secondary | ICD-10-CM

## 2014-02-09 DIAGNOSIS — E039 Hypothyroidism, unspecified: Secondary | ICD-10-CM

## 2014-02-09 DIAGNOSIS — R21 Rash and other nonspecific skin eruption: Secondary | ICD-10-CM

## 2014-02-09 LAB — COMPREHENSIVE METABOLIC PANEL
ALT: 21 U/L (ref 0–35)
AST: 32 U/L (ref 0–37)
Albumin: 4.2 g/dL (ref 3.5–5.2)
Alkaline Phosphatase: 77 U/L (ref 39–117)
BUN: 24 mg/dL — ABNORMAL HIGH (ref 6–23)
CO2: 30 mEq/L (ref 19–32)
Calcium: 10.2 mg/dL (ref 8.4–10.5)
Chloride: 104 mEq/L (ref 96–112)
Creat: 0.82 mg/dL (ref 0.50–1.10)
Glucose, Bld: 91 mg/dL (ref 70–99)
Potassium: 5.2 mEq/L (ref 3.5–5.3)
Sodium: 141 mEq/L (ref 135–145)
Total Bilirubin: 0.7 mg/dL (ref 0.2–1.2)
Total Protein: 7.4 g/dL (ref 6.0–8.3)

## 2014-02-09 LAB — TSH: TSH: 1.984 u[IU]/mL (ref 0.350–4.500)

## 2014-02-09 LAB — VITAMIN B12: Vitamin B-12: 594 pg/mL (ref 211–911)

## 2014-02-09 MED ORDER — METHYLPREDNISOLONE ACETATE 80 MG/ML IJ SUSP
80.0000 mg | Freq: Once | INTRAMUSCULAR | Status: AC
  Administered 2014-02-09: 80 mg via INTRAMUSCULAR

## 2014-02-09 NOTE — Patient Instructions (Signed)
[facility], [place] [phone]

## 2014-02-09 NOTE — Progress Notes (Signed)
Patient ID: [name], female   DOB: 09/25/1921, 80 y.o.   MRN: [id number]  This chart was scribed for Elvina SidleKurt Lauenstein, MD by Charline BillsEssence Howell, ED Scribe. The patient was seen in room 10. Patient's care was started at 11:42 AM.  Patient ID: [name] MRN: [id number], DOB: 04/05/1922, 80 y.o. Date of Encounter: 02/09/2014, 11:42 AM  Primary Physician: No PCP Per Patient  Chief Complaint  Patient presents with   Dermatitis    noticed it for 2 weeks--itches, burning and some toothache, headache    HPI: 80 y.o. year old female with history below presents with ongoing rash to bilateral arms and back. Pt describes the rash as a constant itchy and burning sensation. She reports associated sensitive and dry skin. Pt states that the rash keeps her up at night. Pt was seen in August by Dr. Neva SeatGreene for rash; was given an injection which provided immediate relief. She has tried Eucerin cream which exacerbated rash and showering without a wash cloth.   Leg Swelling Pt wears compression stockings for bilateral leg swelling. Her daughter reports that the swelling is so severe that her legs will "burst and seep". Pt currently takes Lasix.    Past Medical History  Diagnosis Date   Cataract    Arthritis    Chronic kidney disease    Heart murmur    Dementia    Hearing loss      Home Meds: Prior to Admission medications   Medication Sig Start Date End Date Taking? Authorizing Provider  Acetaminophen (TYLENOL 8 HOUR PO) Take by mouth.   Yes Historical Provider, MD  Calcium Carbonate-Vitamin D (CALCIUM 600+D) 600-200 MG-UNIT TABS Take 1 tablet by mouth daily.   Yes Historical Provider, MD  furosemide (LASIX) 20 MG tablet Take 20 mg by mouth. mon-wed-fri only   Yes Historical Provider, MD  IRON PO Take by mouth. mon-wed-fri   Yes Historical Provider, MD  Levothyroxine Sodium (SYNTHROID PO) Take 88 mcg by mouth daily.    Yes Historical Provider, MD  Multiple Vitamin (MULTIVITAMIN) tablet Take 1  tablet by mouth daily.   Yes Historical Provider, MD  Multiple Vitamins-Minerals (OCUVITE PO) Take 1 tablet by mouth daily.   Yes Historical Provider, MD  triamcinolone ointment (KENALOG) 0.5 % Apply 1 application topically 2 (two) times daily as needed. 11/16/13  Yes Carmelina DaneJeffery S Anderson, MD  fluticasone (CUTIVATE) 0.005 % ointment Apply 1 application topically 4 (four) times daily.    Historical Provider, MD  furosemide (LASIX) 40 MG tablet Take 1 tablet (40 mg total) by mouth daily. 11/16/13   Carmelina DaneJeffery S Anderson, MD    Allergies:  Allergies  Allergen Reactions   Sulfa Antibiotics Rash    History   Social History   Marital Status: Widowed    Spouse Name: N/A    Number of Children: N/A   Years of Education: N/A   Occupational History   Not on file.   Social History Main Topics   Smoking status: Never Smoker    Smokeless tobacco: Not on file   Alcohol Use: No   Drug Use: No   Sexual Activity: Not on file   Other Topics Concern   Not on file   Social History Narrative   No narrative on file     Review of Systems: Constitutional: negative for chills, fever, night sweats, weight changes, or fatigue  HEENT: negative for vision changes, hearing loss, congestion, rhinorrhea, ST, epistaxis, or sinus pressure Cardiovascular: negative for chest pain  or palpitations, +leg swelling Respiratory: negative for hemoptysis, wheezing, shortness of breath, or cough Abdominal: negative for abdominal pain, nausea, vomiting, diarrhea, or constipation Dermatological: +rash Neurologic: negative for headache, dizziness, or syncope All other systems reviewed and are otherwise negative with the exception to those above and in the HPI.   Physical Exam: Triage Vitals: Blood pressure 128/70, pulse 70, temperature 99.3 F (37.4 C), temperature source Oral, resp. rate 18, height 5\' 5"  (1.651 m), weight 118 lb 3.2 oz (53.615 kg), SpO2 97.00%., Body mass index is 19.67 kg/(m^2). General: Well  developed, well nourished, in no acute distress. Head: Normocephalic, atraumatic, eyes without discharge, sclera non-icteric, nares are without discharge. Bilateral auditory canals clear, TM's are without perforation, pearly grey and translucent with reflective cone of light bilaterally. Oral cavity moist, posterior pharynx without exudate, erythema, peritonsillar abscess, or post nasal drip.  Neck: Supple. No thyromegaly. Full ROM. No lymphadenopathy. Lungs: Clear bilaterally to auscultation without wheezes, rales, or rhonchi. Breathing is unlabored. Heart: RRR with S1 S2. No murmurs, rubs, or gallops appreciated. Abdomen: Soft, non-tender, non-distended with normoactive bowel sounds. No hepatomegaly. No rebound/guarding. No obvious abdominal masses. Msk:  Strength and tone normal for age. Kyphosis. Extremities/Skin: Warm and dry. No clubbing or cyanosis. No rashes or suspicious lesions. Excoriated dry skin throughout her body. 2+ pitting edema wearing support hose.  Neuro: Alert and oriented X 3. Moves all extremities spontaneously. Gait is normal. CNII-XII grossly in tact. Psych:  Responds to questions appropriately with a normal affect.    ASSESSMENT AND PLAN:  80 y.o. year old female with  1. Xerosis cutis   2. Hypothyroidism, unspecified hypothyroidism type   3. Nutrition disorder   4. Rash   Xerosis cutis - Plan: TSH, Vit D  25 hydroxy (rtn osteoporosis monitoring), Vitamin B12, methylPREDNISolone acetate (DEPO-MEDROL) injection 80 mg  Hypothyroidism, unspecified hypothyroidism type - Plan: TSH, Vit D  25 hydroxy (rtn osteoporosis monitoring), Vitamin B12  Nutrition disorder - Plan: TSH, Vit D  25 hydroxy (rtn osteoporosis monitoring), Vitamin B12  Rash - Plan: Comprehensive metabolic panel  It looks like it's been a while she's had her thyroid checked. His possibilities become hypothyroid or has some other vitamin deficiency since she is so petite  I personally performed the  services described in this documentation, which was scribed in my presence. The recorded information has been reviewed and is accurate.  Signed, Elvina SidleKurt Lauenstein, MD 02/09/2014 11:42 AM

## 2014-02-10 ENCOUNTER — Encounter: Payer: Self-pay | Admitting: Family Medicine

## 2014-02-10 LAB — VITAMIN D 25 HYDROXY (VIT D DEFICIENCY, FRACTURES): Vit D, 25-Hydroxy: 49 ng/mL (ref 30–89)

## 2014-04-23 ENCOUNTER — Ambulatory Visit (INDEPENDENT_AMBULATORY_CARE_PROVIDER_SITE_OTHER): Payer: Medicare Other | Admitting: Family Medicine

## 2014-04-23 VITALS — BP 112/72 | HR 71 | Temp 98.3°F | Resp 17 | Ht 59.0 in | Wt 117.8 lb

## 2014-04-23 DIAGNOSIS — R600 Localized edema: Secondary | ICD-10-CM

## 2014-04-23 DIAGNOSIS — L853 Xerosis cutis: Secondary | ICD-10-CM

## 2014-04-23 MED ORDER — METHYLPREDNISOLONE ACETATE 80 MG/ML IJ SUSP
80.0000 mg | Freq: Once | INTRAMUSCULAR | Status: AC
  Administered 2014-04-23: 80 mg via INTRAMUSCULAR

## 2014-04-23 NOTE — Progress Notes (Signed)
Subjective:  This chart was scribed for Tammy SidleKurt Lauenstein MD by Tammy Mitchell, Medical scribe. This patient was seen in ROOM 2 and the patient's care was started 3:33 PM.   Patient ID: Tammy Mitchell, female    DOB: 01/10/1922, 81 y.o.   MRN: [id number] Chief Complaint  Patient presents with   itching all over    HPI HPI Comments: Tammy SleeperMarie L Mitchell is a 81 y.o. female with a history of arthritis and dementia who [facility] complaining of itching all over. Her daughter says [facility] and Dr. Perrin MalteseGuest gives pt a shot that helps her itchiness. Her daughter says they use cortisone cream for her itchiness which provides some relief. Pt reports the itchiness has been disturbing her sleep. Her daughter thinks it is because of the cold weather change. She reports that pt has leg swelling and wears compression hose that stop below her knee. Pt states she is on lasix medication for her edema.   Past Medical History  Diagnosis Date   Cataract    Arthritis    Chronic kidney disease    Heart murmur    Dementia    Hearing loss    There are no active problems to display for this patient.  Current Outpatient Prescriptions on File Prior to Visit  Medication Sig Dispense Refill   Acetaminophen (TYLENOL 8 HOUR PO) Take by mouth.     Calcium Carbonate-Vitamin D (CALCIUM 600+D) 600-200 MG-UNIT TABS Take 1 tablet by mouth daily.     fluticasone (CUTIVATE) 0.005 % ointment Apply 1 application topically 4 (four) times daily.     furosemide (LASIX) 20 MG tablet Take 20 mg by mouth. mon-wed-fri only     furosemide (LASIX) 40 MG tablet Take 1 tablet (40 mg total) by mouth daily. 30 tablet 3   IRON PO Take by mouth. mon-wed-fri     Levothyroxine Sodium (SYNTHROID PO) Take 88 mcg by mouth daily.      Multiple Vitamin (MULTIVITAMIN) tablet Take 1 tablet by mouth daily.     Multiple Vitamins-Minerals (OCUVITE PO) Take 1 tablet by mouth daily.     triamcinolone  ointment (KENALOG) 0.5 % Apply 1 application topically 2 (two) times daily as needed. 30 g 1   No current facility-administered medications on file prior to visit.   Allergies  Allergen Reactions   Sulfa Antibiotics Rash   Family History  Problem Relation Age of Onset   Stroke Mother    Heart disease Father    Heart disease Brother    Stroke Brother    Past Surgical History  Procedure Laterality Date   Appendectomy     Eye surgery     Fracture surgery     Joint replacement      Review of Systems  Cardiovascular: Positive for leg swelling.  All other systems reviewed and are negative.     Objective:   Physical Exam  Constitutional: She is oriented to person, place, and time. She appears well-developed and well-nourished.  HENT:  Head: Normocephalic and atraumatic.  Neck: Neck supple. No tracheal deviation present.  Cardiovascular: Normal rate.   Pulmonary/Chest: Effort normal. No respiratory distress.  Abdominal: She exhibits no distension.  Neurological: She is alert and oriented to person, place, and time.  Skin: Skin is warm and dry.  Psychiatric: She has a normal mood and affect. Her behavior is normal.  Nursing note and vitals reviewed.  2+ pedal edema with knee high stockings,  topped off just below knee with a tight band. Frozen right shoulder Forearm excoriations    Assessment & Plan:  This chart was scribed in my presence and reviewed by me personally.    ICD-9-CM ICD-10-CM   1. Xerosis cutis 706.8 L85.3   2. Pedal edema 782.3 R60.0    Depo Medrol 80 IM  Suggestions:  Thigh length Jobst stockings from  [facility] [place] Cream using menthol or xylocaine to moiston skin  Signed, Tammy Sidle, MD

## 2014-04-23 NOTE — Addendum Note (Signed)
Addended by: Isaac BlissGALLOWAY, Lawerance Matsuo J on: 04/23/2014 03:53 PM   Modules accepted: Orders

## 2014-05-19 ENCOUNTER — Ambulatory Visit: Payer: Medicare Other | Admitting: Podiatry

## 2014-09-01 ENCOUNTER — Emergency Department (HOSPITAL_COMMUNITY): Payer: Medicare Other

## 2014-09-01 ENCOUNTER — Encounter (HOSPITAL_COMMUNITY): Payer: Self-pay | Admitting: *Deleted

## 2014-09-01 ENCOUNTER — Emergency Department (HOSPITAL_BASED_OUTPATIENT_CLINIC_OR_DEPARTMENT_OTHER)
Admit: 2014-09-01 | Discharge: 2014-09-01 | Disposition: A | Discharge status: Home / Self Care | Payer: Medicare Other | Attending: Emergency Medicine | Admitting: Emergency Medicine

## 2014-09-01 ENCOUNTER — Emergency Department (HOSPITAL_COMMUNITY)
Admission: EM | Admit: 2014-09-01 | Discharge: 2014-09-01 | Disposition: A | Discharge status: Home / Self Care | Payer: Medicare Other | Attending: Emergency Medicine | Admitting: Emergency Medicine

## 2014-09-01 DIAGNOSIS — S52592A Other fractures of lower end of left radius, initial encounter for closed fracture: Secondary | ICD-10-CM | POA: Diagnosis not present

## 2014-09-01 DIAGNOSIS — M7989 Other specified soft tissue disorders: Secondary | ICD-10-CM | POA: Diagnosis not present

## 2014-09-01 DIAGNOSIS — Y9289 Other specified places as the place of occurrence of the external cause: Secondary | ICD-10-CM | POA: Insufficient documentation

## 2014-09-01 DIAGNOSIS — H9190 Unspecified hearing loss, unspecified ear: Secondary | ICD-10-CM | POA: Insufficient documentation

## 2014-09-01 DIAGNOSIS — F0390 Unspecified dementia without behavioral disturbance: Secondary | ICD-10-CM | POA: Insufficient documentation

## 2014-09-01 DIAGNOSIS — Y9389 Activity, other specified: Secondary | ICD-10-CM | POA: Insufficient documentation

## 2014-09-01 DIAGNOSIS — Z79899 Other long term (current) drug therapy: Secondary | ICD-10-CM | POA: Diagnosis not present

## 2014-09-01 DIAGNOSIS — W1839XA Other fall on same level, initial encounter: Secondary | ICD-10-CM | POA: Insufficient documentation

## 2014-09-01 DIAGNOSIS — S0990XA Unspecified injury of head, initial encounter: Secondary | ICD-10-CM | POA: Diagnosis not present

## 2014-09-01 DIAGNOSIS — R011 Cardiac murmur, unspecified: Secondary | ICD-10-CM | POA: Diagnosis not present

## 2014-09-01 DIAGNOSIS — M25532 Pain in left wrist: Secondary | ICD-10-CM

## 2014-09-01 DIAGNOSIS — M1990 Unspecified osteoarthritis, unspecified site: Secondary | ICD-10-CM | POA: Insufficient documentation

## 2014-09-01 DIAGNOSIS — Y998 Other external cause status: Secondary | ICD-10-CM | POA: Insufficient documentation

## 2014-09-01 DIAGNOSIS — N189 Chronic kidney disease, unspecified: Secondary | ICD-10-CM | POA: Insufficient documentation

## 2014-09-01 DIAGNOSIS — S52502A Unspecified fracture of the lower end of left radius, initial encounter for closed fracture: Secondary | ICD-10-CM

## 2014-09-01 DIAGNOSIS — S6992XA Unspecified injury of left wrist, hand and finger(s), initial encounter: Secondary | ICD-10-CM | POA: Diagnosis present

## 2014-09-01 DIAGNOSIS — W19XXXA Unspecified fall, initial encounter: Secondary | ICD-10-CM

## 2014-09-01 LAB — I-STAT CHEM 8, ED
BUN: 17 mg/dL (ref 6–20)
CHLORIDE: 100 mmol/L — AB (ref 101–111)
CREATININE: 0.8 mg/dL (ref 0.44–1.00)
Calcium, Ion: 1.25 mmol/L (ref 1.13–1.30)
GLUCOSE: 101 mg/dL — AB (ref 65–99)
HCT: 37 % (ref 36.0–46.0)
Hemoglobin: 12.6 g/dL (ref 12.0–15.0)
Potassium: 3.2 mmol/L — ABNORMAL LOW (ref 3.5–5.1)
Sodium: 139 mmol/L (ref 135–145)
TCO2: 22 mmol/L (ref 0–100)

## 2014-09-01 LAB — URINALYSIS, ROUTINE W REFLEX MICROSCOPIC
BILIRUBIN URINE: NEGATIVE
GLUCOSE, UA: NEGATIVE mg/dL
Hgb urine dipstick: NEGATIVE
Ketones, ur: 15 mg/dL — AB
Leukocytes, UA: NEGATIVE
Nitrite: NEGATIVE
Protein, ur: NEGATIVE mg/dL
Specific Gravity, Urine: 1.012 (ref 1.005–1.030)
UROBILINOGEN UA: 0.2 mg/dL (ref 0.0–1.0)
pH: 6.5 (ref 5.0–8.0)

## 2014-09-01 MED ORDER — POTASSIUM CHLORIDE 20 MEQ/15ML (10%) PO SOLN
40.0000 meq | Freq: Once | ORAL | Status: AC
  Administered 2014-09-01: 40 meq via ORAL
  Filled 2014-09-01: qty 30

## 2014-09-01 MED ORDER — TRAMADOL HCL 50 MG PO TABS
50.0000 mg | ORAL_TABLET | Freq: Once | ORAL | Status: AC
  Administered 2014-09-01: 50 mg via ORAL

## 2014-09-01 MED ORDER — TRAMADOL HCL 50 MG PO TABS: 50.0000 mg | ORAL_TABLET | Freq: Four times a day (QID) | ORAL | Status: AC | PRN

## 2014-09-01 MED ORDER — LIDOCAINE-EPINEPHRINE (PF) 2 %-1:200000 IJ SOLN
20.0000 mL | Freq: Once | INTRAMUSCULAR | Status: AC
  Administered 2014-09-01: 20 mL
  Filled 2014-09-01: qty 20

## 2014-09-01 MED ORDER — TRAMADOL HCL 50 MG PO TABS
50.0000 mg | ORAL_TABLET | Freq: Once | ORAL | Status: DC
  Filled 2014-09-01: qty 1

## 2014-09-01 NOTE — Progress Notes (Signed)
[facility] Progress Note Patient Details:  [name] 08/28/1921 [id number]  Ortho Devices Type of Ortho Device: Ace wrap, Sugartong splint Ortho Device/Splint Location: LUE Ortho Device/Splint Interventions: Ordered, Application   Jennye MoccasinHughes, Gussie Towson Craig 09/01/2014, 4:31 PM

## 2014-09-01 NOTE — Discharge Instructions (Signed)
Radial Fracture °You have a broken bone (fracture) of the forearm. This is the part of your arm between the elbow and your wrist. Your forearm is made up of two bones. These are the radius and ulna. Your fracture is in the radial shaft. This is the bone in your forearm located on the thumb side. A cast or splint is used to protect and keep your injured bone from moving. The cast or splint will be on generally for about 5 to 6 weeks, with individual variations. °HOME CARE INSTRUCTIONS  °· Keep the injured part elevated while sitting or lying down. Keep the injury above the level of your heart (the center of the chest). This will decrease swelling and pain. °· Apply ice to the injury for 15-20 minutes, 03-04 times per day while awake, for 2 days. Put the ice in a plastic bag and place a towel between the bag of ice and your cast or splint. °· Move your fingers to avoid stiffness and minimize swelling. °· If you have a plaster or fiberglass cast: °¨ Do not try to scratch the skin under the cast using sharp or pointed objects. °¨ Check the skin around the cast every day. You may put lotion on any red or sore areas. °¨ Keep your cast dry and clean. °· If you have a plaster splint: °¨ Wear the splint as directed. °¨ You may loosen the elastic around the splint if your fingers become numb, tingle, or turn cold or blue. °¨ Do not put pressure on any part of your cast or splint. It may break. Rest your cast only on a pillow for the first 24 hours until it is fully hardened. °· Your cast or splint can be protected during bathing with a plastic bag. Do not lower the cast or splint into water. °· Only take over-the-counter or prescription medicines for pain, discomfort, or fever as directed by your caregiver. °SEEK IMMEDIATE MEDICAL CARE IF:  °· Your cast gets damaged or breaks. °· You have more severe pain or swelling than you did before getting the cast. °· You have severe pain when stretching your fingers. °· There is a bad  smell, new stains and/or pus-like (purulent) drainage coming from under the cast. °· Your fingers or hand turn pale or blue and become cold or your loose feeling. °Document Released: 09/13/2005 Document Revised: 06/25/2011 Document Reviewed: 12/10/2005 °Exit[employer]® Patient Information ©2015 [employer]. This information is not intended to replace advice given to you by your health care provider. Make sure you discuss any questions you have with your health care provider. ° °

## 2014-09-01 NOTE — ED Notes (Signed)
Hematoma over right eye and dried blood under right nare.

## 2014-09-01 NOTE — ED Notes (Signed)
CT was ready to transport, will come back. Dr. Micheline Mazeocherty at bedside.

## 2014-09-01 NOTE — ED Notes (Signed)
Pt arrives from [facility]. Pt had an unwitnessed fall around 0730 this morning. [facility] reports she has an obvious deformity to her left wrist, a hematoma to the right side of head over her eyebrow, and an abrasion to her right shoulder. Pt is complaining of pain in her head and left wrist. Pt states she fell trying to get to the bathroom. Pt has a hx of dementia, falls, and vertigo. Pt daughter states she feels like her mother has been altered x3 days, and states she has been talking and not making sense, unaware of time and place and being easily angered. Daughter is aware that dementia is a progressive disease and pt has been dx with dementia for around 1 year.

## 2014-09-01 NOTE — ED Provider Notes (Signed)
CSN: [id number]     Arrival date & time 09/01/14  1131 History   First MD Initiated Contact with Patient 09/01/14 1148     Chief Complaint  Patient presents with  . Fall     (Consider location/radiation/quality/duration/timing/severity/associated sxs/prior Treatment) HPI Comments: Patient presents from her nursing facility After a fall this morning.  Patient states she slipped on her T ED hose and fell forward striking her forehead on the ground.  She is complaining of pain over a right forehead contusion as well as pain of her left wrist which is deformed and abrasion of the right shoulder.  Family state that she had a fall yesterday and had an x-ray.  This was negative for a hip fracture.  She also has been complaining of left lower extremity pain.  She is chronic bilateral lower extremity swelling for which she wears TED hose.  She was more confused yesterday than normal, although today is more at her baseline.  She has no recent illnesses  Patient is a 81 y.o. female presenting with fall.  Fall Pertinent negatives include no chest pain, no abdominal pain, no headaches and no shortness of breath.    Past Medical History  Diagnosis Date  . Cataract   . Arthritis   . Chronic kidney disease   . Heart murmur   . Dementia   . Hearing loss    Past Surgical History  Procedure Laterality Date  . Appendectomy    . Eye surgery    . Fracture surgery    . Joint replacement     Family History  Problem Relation Age of Onset  . Stroke Mother   . Heart disease Father   . Heart disease Brother   . Stroke Brother    History  Substance Use Topics  . Smoking status: Never Smoker   . Smokeless tobacco: Not on file  . Alcohol Use: No   OB History    No data available     Review of Systems  Constitutional: Negative for fever, chills, diaphoresis, activity change, appetite change and fatigue.  HENT: Negative for congestion, facial swelling, rhinorrhea and sore throat.   Eyes: Negative  for photophobia and discharge.  Respiratory: Negative for cough, chest tightness and shortness of breath.   Cardiovascular: Negative for chest pain, palpitations and leg swelling.  Gastrointestinal: Negative for nausea, vomiting, abdominal pain and diarrhea.  Endocrine: Negative for polydipsia and polyuria.  Genitourinary: Negative for dysuria, frequency, difficulty urinating and pelvic pain.  Musculoskeletal: Negative for back pain, arthralgias, neck pain and neck stiffness.  Skin: Negative for color change and wound.  Allergic/Immunologic: Negative for immunocompromised state.  Neurological: Negative for facial asymmetry, weakness, numbness and headaches.  Hematological: Does not bruise/bleed easily.  Psychiatric/Behavioral: Negative for confusion and agitation.      Allergies  Sulfa antibiotics  Home Medications   Prior to Admission medications   Medication Sig Start Date End Date Taking? Authorizing Provider  acetaminophen (TYLENOL) 325 MG tablet Take 325 mg by mouth 3 (three) times daily.   Yes Historical Provider, MD  acetaminophen (TYLENOL) 500 MG tablet Take 500 mg by mouth at bedtime.   Yes Historical Provider, MD  Calcium Carbonate-Vitamin D 600-400 MG-UNIT per tablet Take 1 tablet by mouth every morning.   Yes Historical Provider, MD  ferrous sulfate 325 (65 FE) MG tablet Take 325 mg by mouth every evening.   Yes Historical Provider, MD  furosemide (LASIX) 20 MG tablet Take 20 mg by mouth every other  day. Started on 08/31/14   Yes Historical Provider, MD  furosemide (LASIX) 40 MG tablet Take 1 tablet (40 mg total) by mouth daily. Patient taking differently: Take 40 mg by mouth every other day. Started on 09/01/14 11/16/13  Yes Carmelina Dane, MD  levothyroxine (SYNTHROID, LEVOTHROID) 88 MCG tablet Take 88 mcg by mouth every morning.   Yes Historical Provider, MD  LORazepam (ATIVAN) 0.5 MG tablet Take 1 mg by mouth every 8 (eight) hours as needed for anxiety.   Yes Historical  Provider, MD  Multiple Vitamins-Minerals (CERTAVITE SENIOR/ANTIOXIDANT) TABS Take 1 tablet by mouth every morning.   Yes Historical Provider, MD  Multiple Vitamins-Minerals (ICAPS LUTEIN & ZEAXANTHIN PO) Take 1 tablet by mouth every morning.   Yes Historical Provider, MD  OVER THE COUNTER MEDICATION Take 2 oz by mouth 3 (three) times daily. OTC Med Pass   Yes Historical Provider, MD  traMADol (ULTRAM) 50 MG tablet Take 1 tablet (50 mg total) by mouth every 6 (six) hours as needed. 09/01/14   Toy Cookey, MD  triamcinolone ointment (KENALOG) 0.5 % Apply 1 application topically 2 (two) times daily as needed. Patient not taking: Reported on 09/01/2014 11/16/13   Carmelina Dane, MD   BP 126/50 mmHg  Pulse 81  Temp(Src) 98 F (36.7 C) (Oral)  Resp 17  Ht 5\' 2"  (1.575 m)  Wt 120 lb (54.432 kg)  BMI 21.94 kg/m2  SpO2 95% Physical Exam  Constitutional: She is oriented to person, place, and time. She appears well-developed and well-nourished. No distress.  HENT:  Head: Normocephalic and atraumatic.    Mouth/Throat: No oropharyngeal exudate.  Eyes: Pupils are equal, round, and reactive to light.  Neck: Normal range of motion. Neck supple.  Cardiovascular: Normal rate, regular rhythm and normal heart sounds.  Exam reveals no gallop and no friction rub.   No murmur heard. Pulmonary/Chest: Effort normal and breath sounds normal. No respiratory distress. She has no wheezes. She has no rales.  Abdominal: Soft. Bowel sounds are normal. She exhibits no distension and no mass. There is no tenderness. There is no rebound and no guarding.  Musculoskeletal: She exhibits no edema or tenderness.       Left wrist: She exhibits decreased range of motion, bony tenderness, swelling and deformity.       Arms: Neurological: She is alert and oriented to person, place, and time.  Skin: Skin is warm and dry.  Psychiatric: She has a normal mood and affect.    ED Course  Reduction of fracture Date/Time:  09/01/2014 3:00 PM Performed by: Toy Cookey Authorized by: Toy Cookey Consent: Verbal consent obtained. Risks and benefits: risks, benefits and alternatives were discussed Consent given by: daughter. Patient understanding: patient states understanding of the procedure being performed Patient consent: the patient's understanding of the procedure matches consent given Procedure consent: procedure consent matches procedure scheduled Relevant documents: relevant documents present and verified Test results: test results available and properly labeled Site marked: the operative site was marked Imaging studies: imaging studies available Required items: required blood products, implants, devices, and special equipment available Patient identity confirmed: verbally with patient Time out: Immediately prior to procedure a "time out" was called to verify the correct patient, procedure, equipment, support staff and site/side marked as required. Local anesthesia used: yes Anesthesia: hematoma block Local anesthetic: lidocaine 1% with epinephrine Anesthetic total: 5 ml Patient sedated: no Patient tolerance: Patient tolerated the procedure well with no immediate complications   (including critical care time) Labs  Review Labs Reviewed  URINALYSIS, ROUTINE W REFLEX MICROSCOPIC - Abnormal; Notable for the following:    Ketones, ur 15 (*)    All other components within normal limits  I-STAT CHEM 8, ED - Abnormal; Notable for the following:    Potassium 3.2 (*)    Chloride 100 (*)    Glucose, Bld 101 (*)    All other components within normal limits  URINE CULTURE    Imaging Review Dg Shoulder Right  09/01/2014   CLINICAL DATA:  Status post fall. Abrasion at the anterior right shoulder near the humeral head. Initial encounter.  EXAM: RIGHT SHOULDER - 2+ VIEW  COMPARISON:  Right shoulder radiographs performed 09/13/2009  FINDINGS: There is no evidence of fracture or dislocation. The  patient's right shoulder hemiarthroplasty is grossly unremarkable in appearance, without evidence of loosening. Its superior positioning is stable from 2011.  The acromioclavicular joint demonstrates minimal degenerative change. No significant soft tissue abnormalities are seen. The visualized portions of the right lung are clear.  IMPRESSION: No evidence of fracture or dislocation. Right shoulder hemiarthroplasty is grossly unremarkable in appearance, without evidence of loosening.   Electronically Signed   By: Roanna RaiderJeffery  Chang M.D.   On: 09/01/2014 17:35   Dg Wrist 2 Views Left  09/01/2014   CLINICAL DATA:  Acute left wrist pain after fall.  EXAM: LEFT WRIST - 2 VIEW  COMPARISON:  Same day.  FINDINGS: The left wrist has been casted and immobilized for treatment of comminuted distal left radial fracture. Severe osteoarthritis of first carpometacarpal joint is again noted.  IMPRESSION: Casting and immobilization of distal left radial fracture.   Electronically Signed   By: Lupita RaiderJames  Green Jr, M.D.   On: 09/01/2014 17:35   Dg Wrist Complete Left  09/01/2014   CLINICAL DATA:  Left wrist deformity after fall today. Initial encounter.  EXAM: LEFT WRIST - COMPLETE 3+ VIEW  COMPARISON:  None.  FINDINGS: Moderately displaced and comminuted fracture of the distal left radius is noted. This appears to be closed and posttraumatic. Intra-articular extension is noted. Severe degenerative change involving the first carpometacarpal joint is noted.  IMPRESSION: Moderately displaced and comminuted distal left radial fracture.   Electronically Signed   By: Lupita RaiderJames  Green Jr, M.D.   On: 09/01/2014 14:22   Ct Head Wo Contrast  09/01/2014   CLINICAL DATA:  Recent fall with facial pain, initial encounter  EXAM: CT HEAD WITHOUT CONTRAST  CT CERVICAL SPINE WITHOUT CONTRAST  TECHNIQUE: Multidetector CT imaging of the head and cervical spine was performed following the standard protocol without intravenous contrast. Multiplanar CT image  reconstructions of the cervical spine were also generated.  COMPARISON:  02/03/2010  FINDINGS: CT HEAD FINDINGS  The bony calvarium is intact. A right frontal scalp hematoma is noted. Diffuse atrophic changes are seen. Areas of chronic white matter ischemic change are noted. Lacunar infarcts are noted most notably in the basal ganglia on right stable from the prior exam. Chronic white matter ischemic change is noted. No findings to suggest acute hemorrhage, acute infarction or space-occupying mass lesion are noted.  CT CERVICAL SPINE FINDINGS  Seven cervical segments are well visualized. Vertebral body height is well maintained. Disc space narrowing and osteophytic changes are noted from C4-C7. Very mild degenerative anterolisthesis of C3 on C4 and C6 on C7 is noted. No acute fracture or acute facet abnormality is noted. Multilevel facet hypertrophic changes are noted particularly on the left. No acute fracture or acute facet abnormality is noted.  IMPRESSION: CT of the head: Chronic atrophic and ischemic changes without acute intracranial abnormality.  Small right frontal scalp hematoma.  CT of the cervical spine: Multilevel degenerative change without acute abnormality.   Electronically Signed   By: Alcide CleverMark  Lukens M.D.   On: 09/01/2014 14:11   Ct Cervical Spine Wo Contrast  09/01/2014   CLINICAL DATA:  Recent fall with facial pain, initial encounter  EXAM: CT HEAD WITHOUT CONTRAST  CT CERVICAL SPINE WITHOUT CONTRAST  TECHNIQUE: Multidetector CT imaging of the head and cervical spine was performed following the standard protocol without intravenous contrast. Multiplanar CT image reconstructions of the cervical spine were also generated.  COMPARISON:  02/03/2010  FINDINGS: CT HEAD FINDINGS  The bony calvarium is intact. A right frontal scalp hematoma is noted. Diffuse atrophic changes are seen. Areas of chronic white matter ischemic change are noted. Lacunar infarcts are noted most notably in the basal ganglia on  right stable from the prior exam. Chronic white matter ischemic change is noted. No findings to suggest acute hemorrhage, acute infarction or space-occupying mass lesion are noted.  CT CERVICAL SPINE FINDINGS  Seven cervical segments are well visualized. Vertebral body height is well maintained. Disc space narrowing and osteophytic changes are noted from C4-C7. Very mild degenerative anterolisthesis of C3 on C4 and C6 on C7 is noted. No acute fracture or acute facet abnormality is noted. Multilevel facet hypertrophic changes are noted particularly on the left. No acute fracture or acute facet abnormality is noted.  IMPRESSION: CT of the head: Chronic atrophic and ischemic changes without acute intracranial abnormality.  Small right frontal scalp hematoma.  CT of the cervical spine: Multilevel degenerative change without acute abnormality.   Electronically Signed   By: Alcide CleverMark  Lukens M.D.   On: 09/01/2014 14:11   Dg Shoulder Left  09/01/2014   CLINICAL DATA:  Injury today, fall, left shoulder pain and tenderness, initial encounter.  EXAM: LEFT SHOULDER - 2+ VIEW  COMPARISON:  None.  FINDINGS: Osteopenia. No fracture or dislocation. Degenerative changes are seen in the acromioclavicular joint. Small subacromial spur. Visualized portion of the left chest is grossly unremarkable.  IMPRESSION: 1. No acute findings. 2. Osteopenia. 3. Left acromioclavicular joint osteoarthritis.   Electronically Signed   By: Leanna BattlesMelinda  Blietz M.D.   On: 09/01/2014 14:22     EKG Interpretation   Date/Time:  Wednesday Sep 01 2014 11:56:14 EDT Ventricular Rate:  64 PR Interval:  191 QRS Duration: 95 QT Interval:  435 QTC Calculation: 449 R Axis:   -10 Text Interpretation:  Sinus rhythm Probable anterior infarct, old  Confirmed by DOCHERTY  MD, MEGAN [phone]([id number]) on 09/01/2014 12:05:59 PM      MDM   Final diagnoses:  Wrist pain, acute, left  Fall from standing, initial encounter  Closed head injury without loss of consciousness,  initial encounter  Distal radial fracture, left, closed, initial encounter    Pt is a 81 y.o. female with Pmhx as above who presents with Patient presents from her nursing facility After a fall this morning.  Patient states she slipped on her T ED hose and fell forward striking her forehead on the ground.  She is complaining of pain over a right forehead contusion as well as pain of her left wrist which is deformed and abrasion of the right shoulder.  Family state that she had a fall yesterday and had an x-ray.  This was negative for a hip fracture.  She also has been complaining of left lower extremity  pain.  She is chronic bilateral lower extremity swelling for which she wears TED hose.  On exam, she has a right frontal contusion.  She has ecchymosis and deformity of left wrist and small abrasion of the right shoulder as well as left calf tenderness. Daughter reports waxing/waning mental status and hx of dementia, but that she is making more sense today than yesterday.   CT head, c-spine, BL shoulder negative. XR L wrist shows moderately displaced and comminuted L distal radius fracture. Hematoma block done with reduction (sedation ot used due to report of difficulty airway and age), with mild improvement of alignment. Pt splinted. Screening chem * shows mild hypokalemia which was replaced UA not infected. She does not meet admission criteria given she is at baseline mental status, fall likely mechanical.    Elby Showers Wilensky evaluation in the Emergency Department is complete. It has been determined that no acute conditions requiring further emergency intervention are present at this time. The patient/guardian have been advised of the diagnosis and plan. We have discussed signs and symptoms that warrant return to the ED, such as changes or worsening in symptoms, worsening pain, fever, change in mental status. F/u with ortho in 1 week.       Toy Cookey, MD 09/01/14 [phone]

## 2014-09-01 NOTE — Progress Notes (Signed)
*  PRELIMINARY RESULTS* Vascular Ultrasound Left lower extremity venous duplex has been completed.  Preliminary findings: negative for DVT  Farrel DemarkJill Eunice, RDMS, RVT  09/01/2014, 2:37 PM

## 2014-09-01 NOTE — ED Notes (Signed)
Pt still in radiology.

## 2014-09-02 LAB — URINE CULTURE: Colony Count: >=100000

## 2014-11-26 ENCOUNTER — Encounter (HOSPITAL_COMMUNITY): Payer: Self-pay | Admitting: Oncology

## 2014-11-26 ENCOUNTER — Emergency Department (HOSPITAL_COMMUNITY)
Admission: EM | Admit: 2014-11-26 | Discharge: 2014-11-27 | Disposition: A | Discharge status: Skilled Nursing Facility | Payer: Medicare Other | Attending: Emergency Medicine | Admitting: Emergency Medicine

## 2014-11-26 DIAGNOSIS — H9190 Unspecified hearing loss, unspecified ear: Secondary | ICD-10-CM | POA: Insufficient documentation

## 2014-11-26 DIAGNOSIS — Z79899 Other long term (current) drug therapy: Secondary | ICD-10-CM | POA: Insufficient documentation

## 2014-11-26 DIAGNOSIS — N189 Chronic kidney disease, unspecified: Secondary | ICD-10-CM | POA: Insufficient documentation

## 2014-11-26 DIAGNOSIS — R400 Somnolence: Secondary | ICD-10-CM | POA: Diagnosis not present

## 2014-11-26 DIAGNOSIS — M1990 Unspecified osteoarthritis, unspecified site: Secondary | ICD-10-CM | POA: Diagnosis not present

## 2014-11-26 DIAGNOSIS — Z880 Allergy status to penicillin: Secondary | ICD-10-CM | POA: Insufficient documentation

## 2014-11-26 DIAGNOSIS — R011 Cardiac murmur, unspecified: Secondary | ICD-10-CM | POA: Diagnosis not present

## 2014-11-26 DIAGNOSIS — H269 Unspecified cataract: Secondary | ICD-10-CM | POA: Insufficient documentation

## 2014-11-26 DIAGNOSIS — R4182 Altered mental status, unspecified: Secondary | ICD-10-CM | POA: Diagnosis present

## 2014-11-26 DIAGNOSIS — Z515 Encounter for palliative care: Secondary | ICD-10-CM

## 2014-11-26 NOTE — Discharge Instructions (Signed)
Follow up with hospice as discussed.  Any concern do not hesitate to call the ER or hospice nurse.

## 2014-11-26 NOTE — ED Notes (Signed)
Bed: YN82 Expected date:  Expected time:  Means of arrival:  Comments: 58F AMS after night meds

## 2014-11-26 NOTE — ED Notes (Signed)
Per EMS pt was given her normal nightly ativan at 2000.  At 2030 staff reported to EMS that pt was hard to wake up.  Upon arrival to the ED pt was able to wake and tell this writer her name and birthday.  Pt is a hospice pt d/t failure to thrive.

## 2014-11-26 NOTE — ED Provider Notes (Signed)
CSN: [id number]     Arrival date & time 11/26/14  2123 History   First MD Initiated Contact with Patient 11/26/14 2132     Chief Complaint  Patient presents with  . Altered Mental Status     (Consider location/radiation/quality/duration/timing/severity/associated sxs/prior Treatment) HPI Comments: 81 year old female with history of kidney disease, dementia, failure to thrive, nursing home, recently started on hospice 3 weeks prior since over from the nursing home after she was unresponsive. Nursing home called family and family was not sure what to do and agreed with sending patient to the ER. Patient received Ativan around 8:00 tonight patient has had Ativan for the past few weeks due to agitation. No new medications except for Ativan. Patient sleeping comfortably.  Unable to get details from patient due to altered mental status. Discussed with patient's daughter and patient is hospice/DO NOT RESUSCITATE, goal is comfort.  Patient is a 81 y.o. female presenting with altered mental status. The history is provided by medical records, the EMS personnel and a relative.  Altered Mental Status   Past Medical History  Diagnosis Date  . Cataract   . Arthritis   . Chronic kidney disease   . Heart murmur   . Dementia   . Hearing loss    Past Surgical History  Procedure Laterality Date  . Appendectomy    . Eye surgery    . Fracture surgery    . Joint replacement     Family History  Problem Relation Age of Onset  . Stroke Mother   . Heart disease Father   . Heart disease Brother   . Stroke Brother    Social History  Substance Use Topics  . Smoking status: Never Smoker   . Smokeless tobacco: None  . Alcohol Use: No   OB History    No data available     Review of Systems  Unable to perform ROS: Mental status change      Allergies  Sulfa antibiotics; Clindamycin/lincomycin; and Penicillins  Home Medications   Prior to Admission medications   Medication Sig Start Date  End Date Taking? Authorizing Provider  acetaminophen (TYLENOL) 325 MG tablet Take 325 mg by mouth 3 (three) times daily.   Yes Historical Provider, MD  acetaminophen (TYLENOL) 500 MG tablet Take 500 mg by mouth at bedtime.   Yes Historical Provider, MD  divalproex (DEPAKOTE ER) 500 MG 24 hr tablet Take 500 mg by mouth daily.   Yes Historical Provider, MD  ferrous sulfate 325 (65 FE) MG tablet Take 325 mg by mouth every evening.   Yes Historical Provider, MD  furosemide (LASIX) 20 MG tablet Take 20 mg by mouth every other day. Started on 08/31/14   Yes Historical Provider, MD  furosemide (LASIX) 40 MG tablet Take 1 tablet (40 mg total) by mouth daily. Patient taking differently: Take 40 mg by mouth every other day. Started on 09/01/14 11/16/13  Yes Carmelina Dane, MD  levothyroxine (SYNTHROID, LEVOTHROID) 88 MCG tablet Take 88 mcg by mouth every morning.   Yes Historical Provider, MD  LORazepam (ATIVAN) 0.5 MG tablet Take 1 mg by mouth 3 (three) times daily. And 1 every 8 hours as needed for anxiety   Yes Historical Provider, MD  Multiple Vitamins-Minerals (CERTAVITE SENIOR/ANTIOXIDANT) TABS Take 1 tablet by mouth every morning.   Yes Historical Provider, MD  senna (SENOKOT) 8.6 MG TABS tablet Take 1 tablet by mouth 2 (two) times daily.   Yes Historical Provider, MD  traMADol (ULTRAM) 50 MG  tablet Take 1 tablet (50 mg total) by mouth every 6 (six) hours as needed. Patient taking differently: Take 50 mg by mouth every 6 (six) hours as needed for moderate pain.  09/01/14  Yes Toy Cookey, MD   BP 103/53 mmHg  Pulse 71  Temp(Src) 98.8 F (37.1 C) (Oral)  Resp 15  SpO2 90% Physical Exam  Constitutional: She appears well-developed and well-nourished.  HENT:  Head: Normocephalic and atraumatic.  Eyes: Right eye exhibits no discharge. Left eye exhibits no discharge.  Neck: Normal range of motion. Neck supple. No tracheal deviation present.  Cardiovascular: Normal rate and regular rhythm.    Pulmonary/Chest: Effort normal and breath sounds normal.  Abdominal: Soft. She exhibits no distension. There is no tenderness. There is no guarding.  Musculoskeletal: She exhibits no edema.  Neurological: She is alert.  General lethargy, sleeping comfortably, patient opens her eyes to loud verbal and then goes back to sleep, difficult neuro exam, pupils equal bilateral.  Skin: Skin is warm. No rash noted.  Psychiatric:  Confusion  Nursing note and vitals reviewed.   ED Course  Procedures (including critical care time) Labs Review Labs Reviewed - No data to display  Imaging Review No results found. I, Enid Skeens, personally reviewed and evaluated these images and lab results as part of my medical decision-making.   EKG Interpretation   Date/Time:  Friday November 26 2014 21:33:57 EDT Ventricular Rate:  80 PR Interval:  225 QRS Duration: 82 QT Interval:  424 QTC Calculation: 489 R Axis:   25 Text Interpretation:  Sinus rhythm Prolonged PR interval Nonspecific T  abnormalities, lateral leads Borderline prolonged QT interval Confirmed by  Langford Carias  MD, Khristen Cheyney ([id number]) on 11/26/2014 10:18:10 PM      MDM   Final diagnoses:  Hospice care patient  Somnolence   Patient presents from the nursing home for unresponsive/altered mental status. Patient clinically in a deep sleep, difficult exam however no obvious focality. Patient in no distress no agitation. The nurse myself had a long discussion in regards to goals of care and end-of-life. We discussed the differential diagnosis and the daughter and previous patient wishes would not be to do anything aggressive.  We will hold on blood work urinalysis and further workup at this time.  Family comfortable sending the patient back to the nursing home were continued hospice care and Ativan as needed.  Results and differential diagnosis were discussed with the patient/parent/guardian. Xrays were independently reviewed by myself.  Close  follow up outpatient was discussed, comfortable with the plan.   Medications - No data to display  Filed Vitals:   11/26/14 2306 11/26/14 2315 11/26/14 2345 11/27/14 0000  BP: 100/49 109/50 103/65 103/53  Pulse: 76 72 65 71  Temp: 98.8 F (37.1 C)     TempSrc: Oral     Resp: SpO2: 95% 94% 92% 90%    Final diagnoses:  Hospice care patient  Somnolence       Blane Ohara, MD 11/27/14 [phone]

## 2014-11-26 NOTE — ED Notes (Signed)
[facility] contacted for transport 

## 2015-04-17 DEATH — deceased
